# Patient Record
Sex: Female | Born: 1985 | Hispanic: Yes | Marital: Single | Smoking: Never smoker
Health system: Southern US, Community
[De-identification: ages and names within clinical notes are randomized; demographics above are authoritative.]

## PROBLEM LIST (undated history)

## (undated) DIAGNOSIS — E119 Type 2 diabetes mellitus without complications: Secondary | ICD-10-CM

## (undated) DIAGNOSIS — K2970 Gastritis, unspecified, without bleeding: Secondary | ICD-10-CM

## (undated) DIAGNOSIS — K760 Fatty (change of) liver, not elsewhere classified: Secondary | ICD-10-CM

## (undated) HISTORY — PX: NO PAST SURGERIES: SHX2092

## (undated) HISTORY — DX: Type 2 diabetes mellitus without complications: E11.9

---

## 2021-05-02 ENCOUNTER — Emergency Department (HOSPITAL_COMMUNITY)
Admission: EM | Admit: 2021-05-02 | Discharge: 2021-05-02 | Disposition: A | Discharge status: Home / Self Care | Payer: Self-pay | Attending: Emergency Medicine | Admitting: Emergency Medicine

## 2021-05-02 ENCOUNTER — Encounter (HOSPITAL_COMMUNITY): Payer: Self-pay

## 2021-05-02 ENCOUNTER — Emergency Department (HOSPITAL_COMMUNITY): Payer: Self-pay

## 2021-05-02 ENCOUNTER — Other Ambulatory Visit: Payer: Self-pay

## 2021-05-02 DIAGNOSIS — R519 Headache, unspecified: Secondary | ICD-10-CM | POA: Insufficient documentation

## 2021-05-02 DIAGNOSIS — K2970 Gastritis, unspecified, without bleeding: Secondary | ICD-10-CM | POA: Insufficient documentation

## 2021-05-02 DIAGNOSIS — Z20822 Contact with and (suspected) exposure to covid-19: Secondary | ICD-10-CM | POA: Insufficient documentation

## 2021-05-02 DIAGNOSIS — R42 Dizziness and giddiness: Secondary | ICD-10-CM | POA: Insufficient documentation

## 2021-05-02 LAB — URINALYSIS, ROUTINE W REFLEX MICROSCOPIC
Bilirubin Urine: NEGATIVE
Glucose, UA: NEGATIVE mg/dL
Hgb urine dipstick: NEGATIVE
Ketones, ur: NEGATIVE mg/dL
Leukocytes,Ua: NEGATIVE
Nitrite: NEGATIVE
Protein, ur: NEGATIVE mg/dL
Specific Gravity, Urine: 1.01 (ref 1.005–1.030)
pH: 6 (ref 5.0–8.0)

## 2021-05-02 LAB — CBC WITH DIFFERENTIAL/PLATELET
Abs Immature Granulocytes: 0.02 10*3/uL (ref 0.00–0.07)
Basophils Absolute: 0 10*3/uL (ref 0.0–0.1)
Basophils Relative: 0 %
Eosinophils Absolute: 0.1 10*3/uL (ref 0.0–0.5)
Eosinophils Relative: 1 %
HCT: 37.8 % (ref 36.0–46.0)
Hemoglobin: 13 g/dL (ref 12.0–15.0)
Immature Granulocytes: 0 %
Lymphocytes Relative: 31 %
Lymphs Abs: 3.1 10*3/uL (ref 0.7–4.0)
MCH: 28.6 pg (ref 26.0–34.0)
MCHC: 34.4 g/dL (ref 30.0–36.0)
MCV: 83.1 fL (ref 80.0–100.0)
Monocytes Absolute: 0.6 10*3/uL (ref 0.1–1.0)
Monocytes Relative: 6 %
Neutro Abs: 6.3 10*3/uL (ref 1.7–7.7)
Neutrophils Relative %: 62 %
Platelets: 193 10*3/uL (ref 150–400)
RBC: 4.55 MIL/uL (ref 3.87–5.11)
RDW: 12.7 % (ref 11.5–15.5)
WBC: 10.2 10*3/uL (ref 4.0–10.5)
nRBC: 0 % (ref 0.0–0.2)

## 2021-05-02 LAB — COMPREHENSIVE METABOLIC PANEL
ALT: 30 U/L (ref 0–44)
AST: 24 U/L (ref 15–41)
Albumin: 4.4 g/dL (ref 3.5–5.0)
Alkaline Phosphatase: 86 U/L (ref 38–126)
Anion gap: 10 (ref 5–15)
BUN: 10 mg/dL (ref 6–20)
CO2: 23 mmol/L (ref 22–32)
Calcium: 9.4 mg/dL (ref 8.9–10.3)
Chloride: 105 mmol/L (ref 98–111)
Creatinine, Ser: 0.68 mg/dL (ref 0.44–1.00)
GFR, Estimated: >60 mL/min (ref >60)
Glucose, Bld: 179 mg/dL — ABNORMAL HIGH (ref 70–99)
Potassium: 3.7 mmol/L (ref 3.5–5.1)
Sodium: 138 mmol/L (ref 135–145)
Total Bilirubin: 0.6 mg/dL (ref 0.3–1.2)
Total Protein: 8.2 g/dL — ABNORMAL HIGH (ref 6.5–8.1)

## 2021-05-02 LAB — LIPASE, BLOOD: Lipase: 26 U/L (ref 11–51)

## 2021-05-02 LAB — I-STAT BETA HCG BLOOD, ED (MC, WL, AP ONLY): I-stat hCG, quantitative: <5 m[IU]/mL (ref <5)

## 2021-05-02 LAB — RESP PANEL BY RT-PCR (FLU A&B, COVID) ARPGX2
Influenza A by PCR: NEGATIVE
Influenza B by PCR: NEGATIVE
SARS Coronavirus 2 by RT PCR: NEGATIVE

## 2021-05-02 MED ORDER — PANTOPRAZOLE SODIUM 40 MG PO TBEC: 40.0000 mg | DELAYED_RELEASE_TABLET | Freq: Every day | ORAL | 0 refills | Status: DC

## 2021-05-02 MED ORDER — SODIUM CHLORIDE 0.9 % IV BOLUS
500.0000 mL | Freq: Once | INTRAVENOUS | Status: AC
  Administered 2021-05-02: 500 mL via INTRAVENOUS

## 2021-05-02 MED ORDER — DEXAMETHASONE SODIUM PHOSPHATE 10 MG/ML IJ SOLN
10.0000 mg | Freq: Once | INTRAMUSCULAR | Status: AC
  Administered 2021-05-02: 10 mg via INTRAVENOUS
  Filled 2021-05-02: qty 1

## 2021-05-02 MED ORDER — DIPHENHYDRAMINE HCL 50 MG/ML IJ SOLN
12.5000 mg | Freq: Once | INTRAMUSCULAR | Status: AC
  Administered 2021-05-02: 12.5 mg via INTRAVENOUS
  Filled 2021-05-02: qty 1

## 2021-05-02 MED ORDER — KETOROLAC TROMETHAMINE 30 MG/ML IJ SOLN
30.0000 mg | Freq: Once | INTRAMUSCULAR | Status: AC
  Administered 2021-05-02: 30 mg via INTRAVENOUS
  Filled 2021-05-02: qty 1

## 2021-05-02 MED ORDER — PROCHLORPERAZINE EDISYLATE 10 MG/2ML IJ SOLN
10.0000 mg | Freq: Once | INTRAMUSCULAR | Status: AC
  Administered 2021-05-02: 10 mg via INTRAVENOUS
  Filled 2021-05-02: qty 2

## 2021-05-02 MED ORDER — ONDANSETRON HCL 4 MG/2ML IJ SOLN
4.0000 mg | Freq: Once | INTRAMUSCULAR | Status: AC
  Administered 2021-05-02: 4 mg via INTRAVENOUS
  Filled 2021-05-02: qty 2

## 2021-05-02 NOTE — ED Triage Notes (Signed)
Interpreter used for triage.   Vomiting, dizziness and headache that got worse today. Pt says again, that she always vomits and has a headache but dizziness was new for her.

## 2021-05-02 NOTE — ED Provider Notes (Signed)
**Kristen Kristen** Kristen Kristen   CSN: [id number] Arrival date & time: 05/02/21  0007     History Chief Complaint  Patient presents with   Vomiting    Kristen Kristen is a 35 y.o. female presents to the emergency room with complaints of headache, nausea, vomiting and dizziness. Pt reports dizziness and nausea began 1 week ago and the headache 1 night ago. Pt describes the headache as a strong pain.  Pt endorses photophobia with the headache.   Pt reports the dizziness is intermittent approx 3-4 times per week but is always associated with nausea. Intermittent vomiting but NBNB.  She is unable to describe the dizziness.  No previous symptoms like this.  Denies trauma, MVA, falls or other head injuries.  Denies fever, chills, chest pain, SOB, abd pain, syncope. No previous headaches. Current headache is rated at a 9/10.  No treatments PTA.    The history is provided by the patient, the spouse and medical records. A language interpreter was used.      History reviewed. No pertinent past medical history.  There are no problems to display for this patient.   History reviewed. No pertinent surgical history.   OB History   No obstetric history on file.     No family history on file.  Social History   Tobacco Use   Smoking status: Never   Smokeless tobacco: Never  Substance Use Topics   Alcohol use: Not Currently   Drug use: Never    Home Medications Prior to Admission medications   Medication Sig Start Date End Date Taking? Authorizing Provider  pantoprazole (PROTONIX) 40 MG tablet Take 1 tablet (40 mg total) by mouth daily. 05/02/21  Yes Arlethia Basso, Dahlia Client, PA-C    Allergies    Patient has no known allergies.  Review of Systems   Review of Systems  Constitutional:  Negative for appetite change, diaphoresis, fatigue, fever and unexpected weight change.  HENT:  Negative for mouth sores.   Eyes:  Negative for visual disturbance.   Respiratory:  Negative for cough, chest tightness, shortness of breath and wheezing.   Cardiovascular:  Negative for chest pain.  Gastrointestinal:  Positive for nausea and vomiting. Negative for abdominal pain, constipation and diarrhea.  Endocrine: Negative for polydipsia, polyphagia and polyuria.  Genitourinary:  Negative for dysuria, frequency, hematuria and urgency.  Musculoskeletal:  Negative for back pain and neck stiffness.  Skin:  Negative for rash.  Allergic/Immunologic: Negative for immunocompromised state.  Neurological:  Positive for dizziness and headaches. Negative for syncope and light-headedness.  Hematological:  Does not bruise/bleed easily.  Psychiatric/Behavioral:  Negative for sleep disturbance. The patient is not nervous/anxious.    Physical Exam Updated Vital Signs BP 116/84 (BP Location: Right Arm)   Pulse 78   Temp 98.8 F (37.1 C) (Oral)   Resp 18   Wt 101.6 kg   SpO2 100%   Physical Exam Vitals and nursing Kristen reviewed.  Constitutional:      General: She is not in acute distress.    Appearance: She is well-developed. She is not diaphoretic.  HENT:     Head: Normocephalic and atraumatic.     Right Ear: Tympanic membrane normal.     Left Ear: Tympanic membrane normal.     Nose: Nose normal.     Mouth/Throat:     Mouth: Mucous membranes are moist.  Eyes:     General: No scleral icterus.    Conjunctiva/sclera: Conjunctivae normal.  Pupils: Pupils are equal, round, and reactive to light.     Comments: No horizontal, vertical or rotational nystagmus  Neck:     Comments: Full active and passive ROM without pain No midline or paraspinal tenderness No nuchal rigidity or meningeal signs Cardiovascular:     Rate and Rhythm: Normal rate and regular rhythm.  Pulmonary:     Effort: Pulmonary effort is normal. No respiratory distress.     Breath sounds: No wheezing or rales.  Abdominal:     General: There is no distension.     Palpations: Abdomen is  soft.     Tenderness: There is no abdominal tenderness. There is no guarding or rebound.  Musculoskeletal:        General: Normal range of motion.     Cervical back: Normal range of motion and neck supple.  Lymphadenopathy:     Cervical: No cervical adenopathy.  Skin:    General: Skin is warm and dry.     Findings: No rash.  Neurological:     Mental Status: She is alert and oriented to person, place, and time.     Cranial Nerves: No cranial nerve deficit.     Motor: No abnormal muscle tone.     Coordination: Coordination normal.     Comments: Mental Status:  Alert, oriented, thought content appropriate. Speech fluent without evidence of aphasia. Able to follow 2 step commands without difficulty.  Cranial Nerves:  II:  Peripheral visual fields grossly normal, pupils equal, round, reactive to light III,IV, VI: ptosis not present, extra-ocular motions intact bilaterally  V,VII: smile symmetric, facial light touch sensation equal VIII: hearing grossly normal bilaterally  IX,X: midline uvula rise  XI: bilateral shoulder shrug equal and strong XII: midline tongue extension  Motor:  5/5 in upper and lower extremities bilaterally including strong and equal grip strength and dorsiflexion/plantar flexion Sensory: Pinprick and light touch normal in all extremities.  Cerebellar: normal finger-to-nose with bilateral upper extremities Gait: normal gait and balance CV: distal pulses palpable throughout   Psychiatric:        Behavior: Behavior normal.        Thought Content: Thought content normal.        Judgment: Judgment normal.    ED Results / Procedures / Treatments   Labs (all labs ordered are listed, but only abnormal results are displayed) Labs Reviewed  COMPREHENSIVE METABOLIC PANEL - Abnormal; Notable for the following components:      Result Value   Glucose, Bld 179 (*)    Total Protein 8.2 (*)    All other components within normal limits  RESP PANEL BY RT-PCR (FLU A&B,  COVID) ARPGX2  CBC WITH DIFFERENTIAL/PLATELET  LIPASE, BLOOD  URINALYSIS, ROUTINE W REFLEX MICROSCOPIC  I-STAT BETA HCG BLOOD, ED ([facility], [facility], [facility] ONLY)    Radiology CT HEAD WO CONTRAST ( )  Result Date: 05/02/2021 CLINICAL DATA:  35 year old female with increasing headache. Dizziness and vomiting. EXAM: CT HEAD WITHOUT CONTRAST TECHNIQUE: Contiguous axial images were obtained from the base of the skull through the vertex without intravenous contrast. COMPARISON:  None. FINDINGS: Brain: Normal cerebral volume. No midline shift, ventriculomegaly, mass effect, evidence of mass lesion, intracranial hemorrhage or evidence of cortically based acute infarction. Gray-white matter differentiation is within normal limits throughout the brain. Vascular: No suspicious intracranial vascular hyperdensity. Skull: Negative. Sinuses/Orbits: Visualized paranasal sinuses and mastoids are clear. Tympanic cavities are clear. Other: Visualized orbits and scalp soft tissues are within normal limits. IMPRESSION: Normal noncontrast Head CT.  Electronically Signed   By: Odessa Fleming M.D.   On: 05/02/2021 04:52    Procedures Procedures   Medications Ordered in ED Medications  ondansetron (ZOFRAN) injection 4 mg (4 mg Intravenous Given 05/02/21 0510)  sodium chloride 0.9 % bolus 500 mL (500 mLs Intravenous New Bag/Given 05/02/21 0521)  prochlorperazine (COMPAZINE) injection 10 mg (10 mg Intravenous Given 05/02/21 0558)  diphenhydrAMINE (BENADRYL) injection 12.5 mg (12.5 mg Intravenous Given 05/02/21 0557)  ketorolac (TORADOL) 30 MG/ML injection 30 mg (30 mg Intravenous Given 05/02/21 0558)  dexamethasone (DECADRON) injection 10 mg (10 mg Intravenous Given 05/02/21 9702)    ED Course  I have reviewed the triage vital signs and the nursing notes.  Pertinent labs & imaging results that were available during my care of the patient were reviewed by me and considered in my medical decision making (see chart for details).    MDM  Rules/Calculators/A&P                           Pt presents with dizziness, headache, nausea and vomiting. Labs reassuring.  No evidence of urinary tract infection.  CT head without acute abnormality.  Normal neurologic exam.  Abdomen soft and nontender, moist mucous membranes.  Patient will be given migraine cocktail.  Long discussion with patient and family about potential causes for dizziness.  COVID pending.  Patient will need close follow-up with neurology.  Dizziness is intermittent has been present for several weeks.  Low likelihood acute stroke today.  Patient does complain of some ongoing gastritis and reflux symptoms.  No current treatments.  Will give Protonix.  6:43 AM At shift change care was transferred to [facility] who will follow pending studies, re-evaulate and determine disposition.      Final Clinical Impression(s) / ED Diagnoses Final diagnoses:  Dizziness  Generalized headache  Gastritis without bleeding, unspecified chronicity, unspecified gastritis type    Rx / DC Orders ED Discharge Orders          Ordered    pantoprazole (PROTONIX) 40 MG tablet  Daily        05/02/21 0644             Loyalty Arentz, Dahlia Client, PA-C 05/02/21 [id number]    Glynn Octave, MD 05/02/21 [phone]

## 2021-05-02 NOTE — ED Provider Notes (Signed)
Care transferred from Muthersbaugh PA-C at shift change. Refer to her note for H&P.   Patient able to ambulate and drink fluids without N/V. On evaluation, patient states she is feeling much better. Discussed negative COVID test and referral to outpatient neurology. Patient is stable for discharge. Patient agreeable to plan.    Kristen Harvey 05/02/21 4076    Wynetta Fines, MD 05/02/21 1444

## 2021-05-02 NOTE — ED Notes (Signed)
Pt tolerated ambulating to the bathroom. No complain of N/V.

## 2021-05-02 NOTE — ED Notes (Signed)
Patient tolerated P.O. Challenge ?

## 2021-05-02 NOTE — Discharge Instructions (Addendum)
1. Medications: Protonix daily, usual home medications 2. Treatment: rest, drink plenty of fluids,  3. Follow Up: Please followup with your primary doctor in 2-3 days for discussion of your diagnoses and further evaluation after today's visit; if you do not have a primary care doctor use the resource guide provided to find one; Please return to the ER for new or worsening symptoms

## 2021-09-07 ENCOUNTER — Encounter (HOSPITAL_COMMUNITY): Payer: Self-pay | Admitting: Obstetrics & Gynecology

## 2021-09-07 ENCOUNTER — Other Ambulatory Visit: Payer: Self-pay

## 2021-09-07 ENCOUNTER — Inpatient Hospital Stay (HOSPITAL_COMMUNITY): Payer: Self-pay

## 2021-09-07 ENCOUNTER — Inpatient Hospital Stay (HOSPITAL_COMMUNITY)
Admission: AD | Admit: 2021-09-07 | Discharge: 2021-09-07 | Disposition: A | Discharge status: Home / Self Care | Payer: Self-pay | Attending: Obstetrics & Gynecology | Admitting: Obstetrics & Gynecology

## 2021-09-07 DIAGNOSIS — O219 Vomiting of pregnancy, unspecified: Secondary | ICD-10-CM

## 2021-09-07 DIAGNOSIS — O26891 Other specified pregnancy related conditions, first trimester: Secondary | ICD-10-CM | POA: Insufficient documentation

## 2021-09-07 DIAGNOSIS — R112 Nausea with vomiting, unspecified: Secondary | ICD-10-CM | POA: Insufficient documentation

## 2021-09-07 DIAGNOSIS — R109 Unspecified abdominal pain: Secondary | ICD-10-CM | POA: Insufficient documentation

## 2021-09-07 DIAGNOSIS — Z3A12 12 weeks gestation of pregnancy: Secondary | ICD-10-CM | POA: Insufficient documentation

## 2021-09-07 DIAGNOSIS — O4691 Antepartum hemorrhage, unspecified, first trimester: Secondary | ICD-10-CM | POA: Insufficient documentation

## 2021-09-07 HISTORY — DX: Gastritis, unspecified, without bleeding: K29.70

## 2021-09-07 HISTORY — DX: Fatty (change of) liver, not elsewhere classified: K76.0

## 2021-09-07 LAB — CBC
HCT: 36.5 % (ref 36.0–46.0)
Hemoglobin: 13 g/dL (ref 12.0–15.0)
MCH: 29 pg (ref 26.0–34.0)
MCHC: 35.6 g/dL (ref 30.0–36.0)
MCV: 81.3 fL (ref 80.0–100.0)
Platelets: 200 10*3/uL (ref 150–400)
RBC: 4.49 MIL/uL (ref 3.87–5.11)
RDW: 13.2 % (ref 11.5–15.5)
WBC: 7 10*3/uL (ref 4.0–10.5)
nRBC: 0 % (ref 0.0–0.2)

## 2021-09-07 LAB — URINALYSIS, ROUTINE W REFLEX MICROSCOPIC
Bilirubin Urine: NEGATIVE
Glucose, UA: NEGATIVE mg/dL
Hgb urine dipstick: NEGATIVE
Ketones, ur: >80 mg/dL — AB
Nitrite: NEGATIVE
Protein, ur: NEGATIVE mg/dL
Specific Gravity, Urine: 1.02 (ref 1.005–1.030)
pH: 6 (ref 5.0–8.0)

## 2021-09-07 LAB — WET PREP, GENITAL
Clue Cells Wet Prep HPF POC: NONE SEEN
Sperm: NONE SEEN
Trich, Wet Prep: NONE SEEN
WBC, Wet Prep HPF POC: >=10 — AB (ref <10)
Yeast Wet Prep HPF POC: NONE SEEN

## 2021-09-07 LAB — URINALYSIS, MICROSCOPIC (REFLEX)

## 2021-09-07 LAB — ABO/RH: ABO/RH(D): O POS

## 2021-09-07 LAB — HCG, QUANTITATIVE, PREGNANCY: hCG, Beta Chain, Quant, S: 75755 m[IU]/mL — ABNORMAL HIGH (ref <5)

## 2021-09-07 MED ORDER — LACTATED RINGERS IV BOLUS
1000.0000 mL | Freq: Once | INTRAVENOUS | Status: AC
  Administered 2021-09-07: 1000 mL via INTRAVENOUS

## 2021-09-07 MED ORDER — METOCLOPRAMIDE HCL 5 MG/ML IJ SOLN
10.0000 mg | Freq: Once | INTRAMUSCULAR | Status: AC
  Administered 2021-09-07: 10 mg via INTRAVENOUS
  Filled 2021-09-07: qty 2

## 2021-09-07 MED ORDER — METOCLOPRAMIDE HCL 10 MG PO TABS: 10.0000 mg | ORAL_TABLET | Freq: Three times a day (TID) | ORAL | 0 refills | Status: DC | PRN

## 2021-09-07 MED ORDER — ACETAMINOPHEN 500 MG PO TABS
1000.0000 mg | ORAL_TABLET | Freq: Once | ORAL | Status: AC
  Administered 2021-09-07: 1000 mg via ORAL
  Filled 2021-09-07: qty 2

## 2021-09-07 NOTE — MAU Provider Note (Addendum)
History     CSN: [id number]  Arrival date and time: 09/07/21 [id number]   Event Date/Time   First Provider Initiated Contact with Patient 09/07/21 0950      Chief Complaint  Patient presents with   Nausea   Abdominal Pain   Vaginal Bleeding   Kristen Harvey is a 36yo G3P2002 at [redacted]w[redacted]d who presents for left-sided abdominal pain and blood in urine. She felt a burning pain in her abdomen this morning and noticed blood in her urine. She has not been to the bathroom since this time but the pain has continued and is at 8/10. She does not have any pain or burning with urination. She has noticed some white discharge with urination. She was told she had an ovarian cyst.  She has had nausea and vomiting since the beginning of her pregnancy and has thrown up about 7 times in the last 24 hours. She has not been able to keep food or water down. Her last BM was yesterday morning.    OB History     Gravida  3   Para  2   Term  2   Preterm      AB      Living  2      SAB      IAB      Ectopic      Multiple      Live Births  2         She has had 2 prior vaginal deliveries. There was concern for gestational diabetes during her second pregnancy but she did not get further testing.   Past Medical History:  Diagnosis Date   Fatty liver    Gastritis     History reviewed. No pertinent surgical history.  Family History  Problem Relation Age of Onset   Hypertension Mother    Diabetes Mother    Hypertension Father    Diabetes Father     Social History   Tobacco Use   Smoking status: Never   Smokeless tobacco: Never  Substance Use Topics   Alcohol use: Not Currently   Drug use: Never    Allergies: No Known Allergies  Medications Prior to Admission  Medication Sig Dispense Refill Last Dose   pantoprazole (PROTONIX) 40 MG tablet Take 1 tablet (40 mg total) by mouth daily. 30 tablet 0     Review of Systems  Constitutional:  Negative for chills and fever.   Cardiovascular:  Negative for chest pain.  Gastrointestinal:  Positive for abdominal pain, nausea and vomiting.  Genitourinary:  Positive for hematuria and vaginal discharge.  Physical Exam   Blood pressure 120/87, pulse 89, temperature 98.9 F (37.2 C), temperature source Oral, resp. rate 16, last menstrual period 06/10/2021, SpO2 99 %.  Physical Exam HENT:     Head: Normocephalic and atraumatic.  Eyes:     Extraocular Movements: Extraocular movements intact.  Cardiovascular:     Heart sounds: Normal heart sounds.  Pulmonary:     Effort: Pulmonary effort is normal.     Breath sounds: Normal breath sounds.  Abdominal:     Palpations: Abdomen is soft.     Tenderness: There is abdominal tenderness in the left upper quadrant and left lower quadrant. There is no right CVA tenderness, left CVA tenderness or rebound.  Skin:    General: Skin is warm and dry.  Neurological:     General: No focal deficit present.     Mental Status: She is alert.  Psychiatric:  Mood and Affect: Mood normal.        Behavior: Behavior normal.    MAU Course  Procedures Results for orders placed or performed during the hospital encounter of 09/07/21 (from the past 24 hour(s))  CBC     Status: None   Collection Time: 09/07/21 10:20 AM  Result Value Ref Range   WBC 7.0 4.0 - 10.5 K/uL   RBC 4.49 3.87 - 5.11 MIL/uL   Hemoglobin 13.0 12.0 - 15.0 g/dL   HCT 36.5 36.0 - 46.0 %   MCV 81.3 80.0 - 100.0 fL   MCH 29.0 26.0 - 34.0 pg   MCHC 35.6 30.0 - 36.0 g/dL   RDW 13.2 11.5 - 15.5 %   Platelets 200 150 - 400 K/uL   nRBC 0.0 0.0 - 0.2 %  ABO/Rh     Status: None   Collection Time: 09/07/21 10:20 AM  Result Value Ref Range   ABO/RH(D) O POS    No rh immune globuloin      NOT A RH IMMUNE GLOBULIN CANDIDATE, PT RH POSITIVE Performed at [facility], [place] [address] [place], [place] [place] [address]   hCG, quantitative, pregnancy     Status: Abnormal   Collection Time: 09/07/21 10:20 AM  Result  Value Ref Range   hCG, Beta Chain, Quant, S 75,755 (H) <5 mIU/mL  Urinalysis, Routine w reflex microscopic Urine, Clean Catch     Status: Abnormal   Collection Time: 09/07/21 10:32 AM  Result Value Ref Range   Color, Urine YELLOW YELLOW   APPearance CLEAR CLEAR   Specific Gravity, Urine 1.020 1.005 - 1.030   pH 6.0 5.0 - 8.0   Glucose, UA NEGATIVE NEGATIVE mg/dL   Hgb urine dipstick NEGATIVE NEGATIVE   Bilirubin Urine NEGATIVE NEGATIVE   Ketones, ur >80 (A) NEGATIVE mg/dL   Protein, ur NEGATIVE NEGATIVE mg/dL   Nitrite NEGATIVE NEGATIVE   Leukocytes,Ua TRACE (A) NEGATIVE  Urinalysis, Microscopic (reflex)     Status: Abnormal   Collection Time: 09/07/21 10:32 AM  Result Value Ref Range   RBC / HPF 0-5 0 - 5 RBC/hpf   WBC, UA 0-5 0 - 5 WBC/hpf   Bacteria, UA RARE (A) NONE SEEN   Squamous Epithelial / LPF 0-5 0 - 5  Wet prep, genital     Status: Abnormal   Collection Time: 09/07/21 11:43 AM  Result Value Ref Range   Yeast Wet Prep HPF POC NONE SEEN NONE SEEN   Trich, Wet Prep NONE SEEN NONE SEEN   Clue Cells Wet Prep HPF POC NONE SEEN NONE SEEN   WBC, Wet Prep HPF POC >=10 (A) <10   Sperm NONE SEEN    US OB Comp Less 14 Wks  Result Date: 09/07/2021 CLINICAL DATA:  Pain, vaginal bleeding EXAM: OBSTETRIC <14 WK [place] US DOPPLER ULTRASOUND OF OVARIES TECHNIQUE: Transabdominal ultrasound was performed for evaluation of the gestation as well as the maternal uterus and adnexal regions. Color and duplex Doppler ultrasound was utilized to evaluate blood flow to the ovaries. COMPARISON:  None. FINDINGS: Intrauterine gestational sac: Single Yolk sac:  Not Visualized. Embryo:  Visualized. Cardiac Activity: Visualized. Heart Rate: 151 bpm CRL: 68.6 mm   13 w 1 d                  [place] EDC: 03/14/2022 Subchorionic hemorrhage:  None visualized. Maternal uterus/adnexae: The right ovary is normal in appearance, measuring 2.9 cm x 3.5 cm x 3.0 cm. A small  follicle is seen in the right ovary. The left ovary  is normal measuring 1.5 cm x 2.7 cm x 1.7 cm. No left adnexal lesion is seen. Pulsed Doppler evaluation of both ovaries demonstrates normal appearing low-resistance arterial and venous waveforms. IMPRESSION: 1. Single live intrauterine pregnancy identified with an estimated gestational age of [redacted] weeks 1 day by crown-rump length. 2. No evidence of ovarian torsion. Electronically Signed   By: Valetta Mole M.D.   On: 09/07/2021 12:54   US PELVIC DOPPLER (TORSION R/O OR MASS ARTERIAL FLOW)  Result Date: 09/07/2021 CLINICAL DATA:  Pain, vaginal bleeding EXAM: OBSTETRIC <14 WK [place] US DOPPLER ULTRASOUND OF OVARIES TECHNIQUE: Transabdominal ultrasound was performed for evaluation of the gestation as well as the maternal uterus and adnexal regions. Color and duplex Doppler ultrasound was utilized to evaluate blood flow to the ovaries. COMPARISON:  None. FINDINGS: Intrauterine gestational sac: Single Yolk sac:  Not Visualized. Embryo:  Visualized. Cardiac Activity: Visualized. Heart Rate: 151 bpm CRL: 68.6 mm   13 w 1 d                  [place] EDC: 03/14/2022 Subchorionic hemorrhage:  None visualized. Maternal uterus/adnexae: The right ovary is normal in appearance, measuring 2.9 cm x 3.5 cm x 3.0 cm. A small follicle is seen in the right ovary. The left ovary is normal measuring 1.5 cm x 2.7 cm x 1.7 cm. No left adnexal lesion is seen. Pulsed Doppler evaluation of both ovaries demonstrates normal appearing low-resistance arterial and venous waveforms. IMPRESSION: 1. Single live intrauterine pregnancy identified with an estimated gestational age of [redacted] weeks 1 day by crown-rump length. 2. No evidence of ovarian torsion. Electronically Signed   By: Valetta Mole M.D.   On: 09/07/2021 12:54    MDM Differential diagnosis for hematuria vs vaginal bleeding includes UTI, kidney stone, vaginal infection, miscarriage, and ectopic pregnancy.   - Ectopic pregnancy is less likely due to estimated gestational age of [redacted]w[redacted]d.  -  Unremarkable UTI with no blood in urine - UTI and kidney stone less likely.  - White vaginal discharge and friable cervix visualized on speculum exam - Wet prep negative for candida, trichomonas, and BV.  - Ordered U/S to evaluate for ectopic pregnancy, ovarian cyst, and ovarian torsion.  - Started fluids and reglan for nausea. - Tylenol for pain.  Pending: U/S, GC/chlamydia swab Care taken over by NP  Reggy Eye, Medical Student 09/07/2021 12:34PM     Attestation of Supervision of Student:  I confirm that I have verified the information documented in the nurse practitioner students note and that I have also personally performed the history, physical exam and all medical decision making activities.  I have verified that all services and findings are accurately documented in this student's note; and I agree with management and plan as outlined in the documentation. I have also made any necessary editorial changes.  History Kristen Harvey is a 36 y.o. G3P2002 at [redacted]w[redacted]d who presents via EMS for abdominal pain & vaginal bleeding. Symptoms started this morning. Reports constant burning pain in left lower quadrant. Rates pain 8/10. Nothing makes better or worse. Reports seeing blood in her urine this morning x 1 episode. Also has had continued n/v with the pregnancy. States she's vomited 10 times in the last 24 hours. Does not have antiemetic.  Denies fever, diarrhea, constipation, vaginal discharge, dysuria, or flank pain. Last BM was this morning.   Physical exam BP 104/67    Pulse  74    Temp 98.9 F (37.2 C) (Oral)    Resp 16    LMP 06/10/2021    SpO2 99% Comment: room air  Physical Examination: General appearance - alert, well appearing, and in no distress Mental status - normal mood, behavior, speech, dress, motor activity, and thought processes Eyes - sclera anicteric Chest - clear to auscultation, no wheezes, rales or rhonchi, symmetric air entry Abdomen - soft, flat, TTP  in LLQ. No rebound or guarding. No CVA tenderness Pelvic - NEFG, no blood, moderate amount of tan discharge. Cervix closed/firm Skin - warm & dry  MDM FHT present via doppler  On exam, no blood seen. She is RH positive. Wet prep is negative.  U/a shows trace leuks. No urinary complaints, afebrile, & no CVAT. Urine culture sent.   Treated in MAU with IV fluids, reglan, & tylenol. No vomiting in MAU. Abdominal pain completely resolved.   Ultrasound ordered due to patient reporting history of ovarian cyst last year. Ultrasound shows IUP & no abnormal adnexal masses.   [facility] provided Spanish interpreter Angelica Chessman) used for this encounter* Assessment and Plan  Assessment/Plan 1. Nausea and vomiting during pregnancy prior to [redacted] weeks gestation  -rx reglan  2. [redacted] weeks gestation of pregnancy  -start prenatal care  3. Abdominal pain during pregnancy in first trimester  -reviewed SAB precautions & reasons to return to [place], [place] for [employer] 09/07/2021 3:31 PM

## 2021-09-07 NOTE — MAU Note (Signed)
Kristen Harvey is a 36 y.o. at [redacted]w[redacted]d here in MAU reporting: pt arrived via EMS stating that she went to the bathroom and noticed some bleeding. States she has not seen anymore bleeding. No clots. Also reporting lower abdominal pain that radiates to her upper abdomen. Also having ongoing nausea and vomiting. Reports 7 episodes of emesis.   [facility] and reports she had an u/s.  LMP: 06/10/21  Onset of complaint: ongoing  Pain score: 8/10  Vitals:   09/07/21 0957  BP: 120/87  Pulse: 89  Resp: 16  Temp: 98.9 F (37.2 C)  SpO2: 99%     FHT:159  Lab orders placed from triage: UA

## 2021-09-08 LAB — GC/CHLAMYDIA PROBE AMP (~~LOC~~) NOT AT ARMC
Chlamydia: NEGATIVE
Comment: NEGATIVE
Comment: NORMAL
Neisseria Gonorrhea: NEGATIVE

## 2021-09-08 LAB — CULTURE, OB URINE: Special Requests: NORMAL

## 2021-09-19 ENCOUNTER — Other Ambulatory Visit: Payer: Self-pay

## 2021-09-19 ENCOUNTER — Other Ambulatory Visit: Payer: Self-pay | Admitting: *Deleted

## 2021-09-19 DIAGNOSIS — Z3481 Encounter for supervision of other normal pregnancy, first trimester: Secondary | ICD-10-CM

## 2021-09-21 LAB — CBC/D/PLT+RPR+RH+ABO+RUBIGG...
Antibody Screen: NEGATIVE
Basophils Absolute: 0 10*3/uL (ref 0.0–0.2)
Basos: 0 %
Bilirubin, UA: NEGATIVE
EOS (ABSOLUTE): 0.1 10*3/uL (ref 0.0–0.4)
Eos: 1 %
Glucose, UA: NEGATIVE
HCV Ab: <0.1 s/co ratio (ref 0.0–0.9)
HIV Screen 4th Generation wRfx: NONREACTIVE
Hematocrit: 35.4 % (ref 34.0–46.6)
Hemoglobin: 12 g/dL (ref 11.1–15.9)
Hepatitis B Surface Ag: NEGATIVE
Immature Grans (Abs): 0 10*3/uL (ref 0.0–0.1)
Immature Granulocytes: 0 %
Ketones, UA: NEGATIVE
Lymphocytes Absolute: 2.3 10*3/uL (ref 0.7–3.1)
Lymphs: 32 %
MCH: 28.2 pg (ref 26.6–33.0)
MCHC: 33.9 g/dL (ref 31.5–35.7)
MCV: 83 fL (ref 79–97)
Monocytes Absolute: 0.5 10*3/uL (ref 0.1–0.9)
Monocytes: 7 %
Neutrophils Absolute: 4.3 10*3/uL (ref 1.4–7.0)
Neutrophils: 60 %
Nitrite, UA: NEGATIVE
Platelets: 177 10*3/uL (ref 150–450)
Protein,UA: NEGATIVE
RBC, UA: NEGATIVE
RBC: 4.25 x10E6/uL (ref 3.77–5.28)
RDW: 14.3 % (ref 11.7–15.4)
RPR Ser Ql: NONREACTIVE
Rh Factor: POSITIVE
Rubella Antibodies, IGG: 19 index (ref >0.99)
Specific Gravity, UA: 1.013 (ref 1.005–1.030)
Urobilinogen, Ur: 0.2 mg/dL (ref 0.2–1.0)
WBC: 7.1 10*3/uL (ref 3.4–10.8)
pH, UA: 6 (ref 5.0–7.5)

## 2021-09-21 LAB — URINE CULTURE, OB REFLEX

## 2021-09-21 LAB — HGB FRACTIONATION CASCADE
Hgb A2: 3.2 % (ref 1.8–3.2)
Hgb A: 56.2 % — ABNORMAL LOW (ref 96.4–98.8)
Hgb F: 0 % (ref 0.0–2.0)
Hgb S: 40.6 % — ABNORMAL HIGH

## 2021-09-21 LAB — MICROSCOPIC EXAMINATION
Bacteria, UA: NONE SEEN
Casts: NONE SEEN /lpf
RBC, Urine: NONE SEEN /hpf (ref 0–2)

## 2021-09-21 LAB — HGB SOLUBILITY: Hgb Solubility: POSITIVE — AB

## 2021-09-21 LAB — HCV INTERPRETATION

## 2021-09-25 DIAGNOSIS — Z3490 Encounter for supervision of normal pregnancy, unspecified, unspecified trimester: Secondary | ICD-10-CM | POA: Insufficient documentation

## 2021-09-25 NOTE — Progress Notes (Addendum)
Patient Name: Angely Ponds Munguia-Rodas Date of Birth: 09/23/1985 [place] Initial Prenatal Visit  Rodricka Catron Sa is a 36 y.o. year old G3P2002 at [redacted]w[redacted]d who presents for her initial prenatal visit.  An inperson Spanish speaking interpretor was used for this encounter.    Attended with baby's father-Antonio who is not her partner  Pregnancy is not planned She reports nausea, vomiting and reduced appetite  She is taking a prenatal vitamin.  She denies pelvic pain or vaginal bleeding.   Pregnancy Dating: The patient is dated by [place].  LMP: October 99991111 Period is certain:  Yes.  Periods were regular:  Yes but pt was on OCP LMP was a typical period:  No. OCP Using hormonal contraception in 3 months prior to conception: Yes  Lab Review: Blood type: O Rh Status: + Antibody screen: Negative HIV: Negative RPR: Negative Hemoglobin electrophoresis reviewed: Yes-sickle cell carrier Results of OB urine culture are: Negative Rubella: Immune Hep C Ab: Negative Varicella status is Unknown  PMH: Reviewed and as detailed below: HTN: No  Gestational Hypertension/preeclampsia: No  Type 1 or 2 Diabetes: No  Depression:  No  Seizure disorder:  No VTE: No ,  History of STI yes-chlamydia per chart review  Abnormal Pap smear:  No, Genital herpes simplex:  No   PSH: Gynecologic Surgery:  no Surgical history reviewed, notable for: no   Obstetric History: Obstetric history tab updated and reviewed.  Summary of prior pregnancies: 2 prior  Cesarean delivery: No  Gestational Diabetes:  No Hypertension in pregnancy: No History of preterm birth: No History of LGA/SGA infant:  Yes History of shoulder dystocia: No Indications for referral were reviewed, and the patient has no obstetric indications for referral to [facility] at this time.   Social History: FOB name: Antonio   Tobacco use: No Alcohol use:  No Other substance use:  No  Current  Medications:  Pre-natals  Reviewed and appropriate in pregnancy.   Genetic and Infection Screen: Flow Sheet Updated Yes  Prenatal Exam: Gen: Well nourished, well developed.  No distress.  Vitals noted. HEENT: Normocephalic, atraumatic.  Neck supple without cervical lymphadenopathy, thyromegaly or thyroid nodules.  Fair dentition. CV: RRR no murmur, gallops or rubs Lungs: CTA B.  Normal respiratory effort without wheezes or rales. Abd: soft, NTND. +BS.  Uterus not appreciated above pelvis. GU: Normal external female genitalia without lesions.  Nl vaginal, well rugated without lesions. No vaginal discharge.   Ext: No clubbing, cyanosis or edema. Psych: Normal grooming and dress.  Not depressed or anxious appearing.  Normal thought content and process without flight of ideas or looseness of associations  Fetal heart tones:  not detected today*  Assessment/Plan:  Andriana Santoni Munguia-Rodas is a 36 y.o. G3P2002 at [redacted]w[redacted]d who presents to initiate prenatal care. She is doing well.  Current pregnancy issues include nausea.  Routine prenatal care: As dating is not reliable, a dating ultrasound has not been ordered. Dating tab updated. DATING VIA [place] Pre-pregnancy weight updated. Expected weight gain this pregnancy is 11-20 pounds  Prenatal labs reviewed, notable for SICKLE CELL CARRIER . Indications for referral to HROB were reviewed and the patient does not meet criteria for referral.  Medication list reviewed and updated.  Recommended patient see a dentist for regular care.  Bleeding and pain precautions reviewed. Importance of prenatal vitamins reviewed.  Genetic screening offered. Patient opted for: quad screen at 16-20 weeks. The patient has the following indications for aspirin to begin  81 mg at 12-16 weeks: One high risk condition: no single high risk condition  MORE than one moderate risk condition: obesity and low SES   Aspirin was  recommended today based upon above risk factors  (one high risk condition or more than one moderate risk factor)  The patient will not be age 25 or over at time of delivery. Referral to genetic counseling was not offered today.  The patient has the following risk factors for preexisting diabetes: BMI > 25 and high risk ethnicity (Latino, [place] American, Native American, [place], Asian Optometrist) . An early 1 hour glucose tolerance test was ordered. Pregnancy Medical Home and PHQ-9 forms completed, problems noted: Yes  2. Pregnancy issues include the following which were addressed today. Due to time constraints, interpretor needs and pt presenting for initial Ob visit at 15 weeks unable to complete full visit. Discussed: Sickle cell carrier Varicella Ig  Anatomy US booked Pt received flu and covid vaccine today  F/u with me on 09/29/21 to complete initial OB visit and to discuss: Starting ASA 81mg   Anti-emetic medications  1 hr GTT, prescribed zofran which pt can have one hr before test to avoid nausea/vomiting Pap smear Quad screen    Flowsheet Row Initial Prenatal from 09/26/2021 in [facility]  PHQ-9 Total Score 16         Follow up 3 days

## 2021-09-25 NOTE — Assessment & Plan Note (Addendum)
°  Nursing Staff Provider  Office Location  [facility]  Dating  [place]  Language   Anatomy US    Flu Vaccine  09/26/21 Genetic Screen  NIPS:   AFP:   First Screen:  Quad:    TDaP vaccine    Hgb A1C or  GTT Early  Third trimester   Rhogam     LAB RESULTS   Feeding Plan BF  Blood Type O/Positive/-- (01/30 1102)   Contraception BLT Antibody Negative (01/30 1102)  Circumcision  Rubella 19.00 (01/30 1102)  Pediatrician  [facility]  RPR Non Reactive (01/30 1102)   Support Person  HBsAg Negative (01/30 1102)   Prenatal Classes  HIV Non Reactive (01/30 1102)  BTL Consent  GBS  (For PCN allergy, check sensitivities)   VBAC Consent  Pap   Resident PCP   Hgb Electro  Sick cell carrier   COVID Vax  09/26/21 CF   Hep C Antibody  Neg  SMA   Aspirin indicated  Yes  Waterbirth  Refer to [facility] -> [ ]  Class [ ]  Consent [ ]  CNM visit

## 2021-09-26 ENCOUNTER — Ambulatory Visit (INDEPENDENT_AMBULATORY_CARE_PROVIDER_SITE_OTHER): Payer: Self-pay | Admitting: Family Medicine

## 2021-09-26 ENCOUNTER — Ambulatory Visit (INDEPENDENT_AMBULATORY_CARE_PROVIDER_SITE_OTHER): Payer: Self-pay

## 2021-09-26 ENCOUNTER — Other Ambulatory Visit: Payer: Self-pay | Admitting: Family Medicine

## 2021-09-26 ENCOUNTER — Other Ambulatory Visit: Payer: Self-pay

## 2021-09-26 VITALS — BP 107/67 | HR 80 | Wt 215.0 lb

## 2021-09-26 DIAGNOSIS — Z23 Encounter for immunization: Secondary | ICD-10-CM

## 2021-09-26 DIAGNOSIS — Z3491 Encounter for supervision of normal pregnancy, unspecified, first trimester: Secondary | ICD-10-CM

## 2021-09-26 MED ORDER — ONDANSETRON HCL 4 MG PO TABS: 4.0000 mg | ORAL_TABLET | Freq: Once | ORAL | 0 refills | Status: AC

## 2021-09-26 NOTE — Patient Instructions (Signed)
Gracias por venir a verme hoy. Fue Psychiatrist. Hoy hablamos de Engineer, civil (consulting). No pudimos completar la visita hoy, as que regrese en [employer] para la segunda parte de esta visita.  Hemos reservado la exploracin de anatoma. Le llamaremos para la fecha y hora de la cita.  Obtendremos algunos laboratorios hoy. Si son anormales o necesitamos hacer algo al respecto, lo llamar. Si son normales, Transport planner un mensaje en MyChart (si est Musician) o una carta por correo. Si no recibe noticias nuestras en 2 semanas, llame a la oficina al nmero que aparece a continuacin.  Por favor, sgueme en unos das.  Si tiene alguna pregunta o inquietud, no dude en llamar a la oficina al [phone].  Los mejores deseos,  Dr Arletta Bale a MAU en [facility] en [place] si: ? Tiene calambres/contracciones que no desaparecen con agua potable ? Tu agua se rompe. A veces es un gran chorro de lquido, a veces es solo un goteo que sigue mojando tu ropa interior o corriendo por tus piernas. ? Tiene sangrado vaginal. ? No siente que su beb se mueva normalmente. Si no lo hace, busque algo de comer y beber, acustese y concntrese en sentir cmo se mueve su beb. Si su beb todava no se mueve con normalidad, debe ir a MAU.

## 2021-09-29 ENCOUNTER — Other Ambulatory Visit (HOSPITAL_COMMUNITY)
Admission: RE | Admit: 2021-09-29 | Discharge: 2021-09-29 | Disposition: A | Discharge status: Home / Self Care | Payer: Self-pay | Source: Ambulatory Visit | Attending: Family Medicine | Admitting: Family Medicine

## 2021-09-29 ENCOUNTER — Other Ambulatory Visit: Payer: Self-pay

## 2021-09-29 ENCOUNTER — Ambulatory Visit (INDEPENDENT_AMBULATORY_CARE_PROVIDER_SITE_OTHER): Payer: Self-pay | Admitting: Family Medicine

## 2021-09-29 VITALS — BP 104/72 | HR 84 | Wt 210.2 lb

## 2021-09-29 DIAGNOSIS — Z0000 Encounter for general adult medical examination without abnormal findings: Secondary | ICD-10-CM

## 2021-09-29 DIAGNOSIS — Z3A15 15 weeks gestation of pregnancy: Secondary | ICD-10-CM | POA: Insufficient documentation

## 2021-09-29 DIAGNOSIS — O0991 Supervision of high risk pregnancy, unspecified, first trimester: Secondary | ICD-10-CM | POA: Insufficient documentation

## 2021-09-29 DIAGNOSIS — O99012 Anemia complicating pregnancy, second trimester: Secondary | ICD-10-CM | POA: Insufficient documentation

## 2021-09-29 DIAGNOSIS — N904 Leukoplakia of vulva: Secondary | ICD-10-CM | POA: Insufficient documentation

## 2021-09-29 DIAGNOSIS — O26892 Other specified pregnancy related conditions, second trimester: Secondary | ICD-10-CM | POA: Insufficient documentation

## 2021-09-29 DIAGNOSIS — Z3491 Encounter for supervision of normal pregnancy, unspecified, first trimester: Secondary | ICD-10-CM

## 2021-09-29 DIAGNOSIS — O24419 Gestational diabetes mellitus in pregnancy, unspecified control: Secondary | ICD-10-CM

## 2021-09-29 DIAGNOSIS — D573 Sickle-cell trait: Secondary | ICD-10-CM | POA: Insufficient documentation

## 2021-09-29 DIAGNOSIS — Z148 Genetic carrier of other disease: Secondary | ICD-10-CM | POA: Insufficient documentation

## 2021-09-29 LAB — POCT 1 HR PRENATAL GLUCOSE: Glucose 1 Hr Prenatal, POC: 244 mg/dL

## 2021-09-29 MED ORDER — DOXYLAMINE SUCCINATE (SLEEP) 25 MG PO TABS: 25.0000 mg | ORAL_TABLET | Freq: Every day | ORAL | 0 refills | Status: DC | PRN

## 2021-09-29 MED ORDER — TRIAMCINOLONE ACETONIDE 0.1 % EX CREA: 1.0000 "application " | TOPICAL_CREAM | Freq: Two times a day (BID) | CUTANEOUS | 0 refills | Status: DC

## 2021-09-29 MED ORDER — ASPIRIN EC 81 MG PO TBEC: 81.0000 mg | DELAYED_RELEASE_TABLET | Freq: Every day | ORAL | 11 refills | Status: DC

## 2021-09-29 NOTE — Patient Instructions (Signed)
° ° °  Gracias por venir a verme hoy. Fue Psychiatrist. Hoy hablamos de la lesin en la regin genital. Recomiendo crema con esteroides en el rea afectada dos veces al da. No aplicar sobre piel sana. Haga un seguimiento con nosotros si no mejora, ya que es posible que necesitemos una biopsia.  Remitirlo a gentica para la deteccin de la enfermedad de clulas falciformes en la pareja. Te llamarn para una cita.  Tiene diabetes gestacional: la remitir a Radio producer de Conservator, museum/gallery. Te llamarn para una cita.  Comience con aspirina de inmediato   Vaya a MAU en [facility] en [place] si: ? Tiene calambres/contracciones que no desaparecen con agua potable ? Tu agua se rompe. A veces es un gran chorro de lquido, a veces es solo un goteo que sigue mojando tu ropa interior o corriendo por tus piernas. ? Tiene sangrado vaginal. ? No siente que su beb se mueva normalmente. Si no lo hace, busque algo de comer y beber, acustese y concntrese en sentir cmo se mueve su beb. Si su beb todava no se mueve con normalidad, debe ir a MAU.  Seguimiento segn sea necesario  Si tiene alguna pregunta o inquietud, no dude en llamar a la oficina al [phone].  Los mejores deseos,  Dr Allena Katz    Thank you for coming to see me today. It was a pleasure. Today we discussed the lesion on the genital region. I recommend steroid cream to the affected area twice a day. Do not apply to healthy skin. Follow up with [place] if it does not improve as we may need to biopsy it.  Referring you to genetics for partner screening for sickle cell disease. They will call you for an appointment   You have gestational diabetes-I am referring you to high risk OB. They will call you for an appointment   Start aspirin right away   Go to the MAU at [facility] at [facility] if: You have cramping/contractions that do not go away with drinking water Your water breaks.  Sometimes it is a big gush  of fluid, sometimes it is just a trickle that keeps getting your underwear wet or running down your legs You have vaginal bleeding.    You do not feel your baby moving like normal.  If you do not, get something to eat and drink and lay down and focus on feeling your baby move. If your baby is still not moving like normal, you should go to MAU.   Follow up as needed  If you have any questions or concerns, please do not hesitate to call the office at [phone].  Best wishes,   Dr Allena Katz

## 2021-09-29 NOTE — Progress Notes (Signed)
° ° ° °  SUBJECTIVE:   CHIEF COMPLAINT / HPI:   Kristen Harvey is a 36 y.o. female presents for OB visit  Most of initial OB visit was covered a few days ago but due to time constraints unable to complete this visit  Denies contractions, fluid leakage or vaginal bleeding. She does endorse nausea and vomiting.   Pt presents for pap smear.   FOB not present today.  Flowsheet Row Routine Prenatal from 09/29/2021 in [facility]  PHQ-9 Total Score 0        Health Maintenance Due  Topic   TETANUS/TDAP    PAP SMEAR-Modifier      PERTINENT  PMH / PSH: none  OBJECTIVE:   BP 104/72    Pulse 84    Wt 210 lb 4 oz (95.4 kg)    LMP 06/10/2021    General: Alert, no acute distress Cardio: well perfused  Pulm: normal work of breathing Neuro: Cranial nerves grossly intact    Pelvic Exam chaperoned by CMA Tashira         External: normal female genitalia without lesions or masses        Vagina: normal without lesions or masses        Cervix: normal without lesions or masses        Pap smear: performed  Irregular white patch over right side of vulva as seen below      ASSESSMENT/PLAN:   Lichen sclerosus of female genitalia Likely lichen sclerosis, possibly flared by pregnancy. Recommended triamcinolone ointment BID to affected area. If no improvement within a few weeks recommended f/u in derm clinic.  Sickle cell trait (HCC) Referred to genetics for further counseling and for genetic testing of FOB.   Gestational diabetes mellitus Early GTT with glucose 244. Dx with gestational diabetes Discussed risks of GDM. Referred to high risk OB.   Supervision of high risk pregnancy in first trimester Obtained pap and quad screen today. Recommended pt starts ASA 81mg  due to risk of pre-clampsia/hypertensive disorders of pregnancy. Also sent in prescription of Unisom to pharmacy for nausea/vomiting.     , MD PGY-3 [facility]

## 2021-09-30 DIAGNOSIS — Z0000 Encounter for general adult medical examination without abnormal findings: Secondary | ICD-10-CM | POA: Insufficient documentation

## 2021-09-30 DIAGNOSIS — N904 Leukoplakia of vulva: Secondary | ICD-10-CM | POA: Insufficient documentation

## 2021-09-30 DIAGNOSIS — O24419 Gestational diabetes mellitus in pregnancy, unspecified control: Secondary | ICD-10-CM | POA: Insufficient documentation

## 2021-09-30 DIAGNOSIS — D573 Sickle-cell trait: Secondary | ICD-10-CM | POA: Insufficient documentation

## 2021-09-30 DIAGNOSIS — O0991 Supervision of high risk pregnancy, unspecified, first trimester: Secondary | ICD-10-CM | POA: Insufficient documentation

## 2021-09-30 LAB — CYTOLOGY - PAP
Comment: NEGATIVE
Diagnosis: NEGATIVE
High risk HPV: NEGATIVE

## 2021-09-30 NOTE — Assessment & Plan Note (Addendum)
Obtained pap and quad screen today. Recommended pt starts ASA 81mg  due to risk of pre-clampsia/hypertensive disorders of pregnancy. Also sent in prescription of Unisom to pharmacy for nausea/vomiting.

## 2021-09-30 NOTE — Assessment & Plan Note (Signed)
Likely lichen sclerosis, possibly flared by pregnancy. Recommended triamcinolone ointment BID to affected area. If no improvement within a few weeks recommended f/u in derm clinic.

## 2021-09-30 NOTE — Assessment & Plan Note (Signed)
Referred to genetics for further counseling and for genetic testing of FOB.

## 2021-09-30 NOTE — Assessment & Plan Note (Signed)
Early GTT with glucose 244. Dx with gestational diabetes Discussed risks of GDM. Referred to high risk OB.

## 2021-10-01 LAB — AFP TETRA
DIA Mom Value: 0.86
DIA Value (EIA): 122.04 pg/mL
DSR (By Age)    1 IN: 353
DSR (Second Trimester) 1 IN: 6857
Gestational Age: 15 WEEKS
MSAFP Mom: 1.26
MSAFP: 31.5 ng/mL
MSHCG Mom: 0.83
MSHCG: 40505 m[IU]/mL
Maternal Age At EDD: 34.2 yr
Osb Risk: 5411
T18 (By Age): 1:1373 {titer}
Test Results:: NEGATIVE
Weight: 210 [lb_av]
uE3 Mom: 1.36
uE3 Value: 0.98 ng/mL

## 2021-10-01 LAB — VARICELLA ZOSTER ANTIBODY, IGG: Varicella zoster IgG: <135 index — ABNORMAL LOW (ref >165)

## 2021-10-04 ENCOUNTER — Other Ambulatory Visit: Payer: Self-pay | Admitting: Family Medicine

## 2021-10-04 ENCOUNTER — Telehealth: Payer: Self-pay

## 2021-10-04 MED ORDER — PYRIDOXINE HCL 25 MG PO TABS: 25.0000 mg | ORAL_TABLET | Freq: Every day | ORAL | 0 refills | Status: AC

## 2021-10-04 NOTE — Telephone Encounter (Signed)
Sure I have sent in b6 25mg  tablets to the pharmacy. I sent in unisom which is doxylamine as diclegis (doxylamine/b6 combod) is too expensive. Please could you let the patient know? Please note she has been referred to high risk Ob for the rest of her pre-natal care. Thank you.

## 2021-10-04 NOTE — Telephone Encounter (Signed)
Patient calls nurse line regarding concerns with Unisom. Patient was told that this medication was a sleep aide and wasn't sure exactly how she should be taking it.   Spoke with Dr. Thompson Grayer regarding Unisom. Recommended forwarding to Dr. Posey Pronto to send in rx for vitamin B6 as well.   Please advise.   Talbot Grumbling, RN

## 2021-10-04 NOTE — Telephone Encounter (Signed)
Called patient with [place] interpreter. Informed of below. All questions answered.   Talbot Grumbling, RN

## 2021-10-13 ENCOUNTER — Other Ambulatory Visit: Payer: Self-pay

## 2021-10-13 ENCOUNTER — Ambulatory Visit (INDEPENDENT_AMBULATORY_CARE_PROVIDER_SITE_OTHER): Payer: Self-pay

## 2021-10-13 DIAGNOSIS — O0991 Supervision of high risk pregnancy, unspecified, first trimester: Secondary | ICD-10-CM

## 2021-10-13 NOTE — Progress Notes (Signed)
New OB Intake  Here today for New OB Intake. Tranferring care from [facility] due to early GDM. We discussed her EDD of 03/17/22 that is based on LMP of 06/10/21. Pt is G3/P2002. I reviewed her allergies, medications, Medical/Surgical/OB history, and appropriate screenings. I informed her of [facility] services. Based on history, this pregnancy is complicated by gestational diabetes.  Patient Active Problem List   Diagnosis Date Noted   Lichen sclerosus of female genitalia 09/30/2021   Sickle cell trait (HCC) 09/30/2021   Gestational diabetes mellitus 09/30/2021   Supervision of high risk pregnancy in first trimester 09/30/2021   Concerns addressed today Patient has older child that just arrived from [place]. Needs pediatrician for this child and new baby. Warm hand-off completed with prenatal navigator Renea Ee for assistance with pediatrician, [facility] Financial Assistance application, and Architect.  Delivery Plans:  Plans to deliver at [facility].   MyChart MyChart downloaded and account created with patient.  Anatomy US Patient is enrolled in [facility]-a-mom. Will be scheduled at [facility] once March schedule opens.   Labs Routine labs drawn at [facility] new OB.   COVID Vaccine Patient has one dose Pfizer, will return to [facility] on 10/17/21 for second dose.   Is patient a CenteringPregnancy candidate? Not a candidate   Is patient a Mom+Baby Combined Care candidate? Not a candidate     Social Determinants of Health Food Insecurity: Patient expresses food insecurity. Food Market information given to patient; explained patient may visit at the end of first OB appointment. Transportation: Patient denies transportation needs. Childcare: Discussed no children allowed at ultrasound appointments. Offered childcare services; patient declines childcare services at this time.  First visit review I reviewed new OB appt with pt. I explained she will have a provider visit with  Merian Capron, MD; encounter routed to appropriate provider.   Marjo Bicker, RN 10/14/2021  12:24 PM

## 2021-10-19 ENCOUNTER — Telehealth: Payer: Self-pay

## 2021-10-19 NOTE — Telephone Encounter (Signed)
Anatomy US scheduled with [facility] [place] for 10/24/21 at 1 PM. Pt notified by prenatal navigator [place]. MFM appt cancelled.  ?

## 2021-10-21 ENCOUNTER — Ambulatory Visit: Payer: Self-pay

## 2021-11-01 ENCOUNTER — Ambulatory Visit (INDEPENDENT_AMBULATORY_CARE_PROVIDER_SITE_OTHER): Payer: Self-pay | Admitting: Family Medicine

## 2021-11-01 ENCOUNTER — Other Ambulatory Visit: Payer: Self-pay

## 2021-11-01 ENCOUNTER — Encounter: Payer: Self-pay | Admitting: Family Medicine

## 2021-11-01 VITALS — BP 114/75 | HR 81 | Wt 213.1 lb

## 2021-11-01 DIAGNOSIS — N904 Leukoplakia of vulva: Secondary | ICD-10-CM

## 2021-11-01 DIAGNOSIS — O0991 Supervision of high risk pregnancy, unspecified, first trimester: Secondary | ICD-10-CM

## 2021-11-01 DIAGNOSIS — O24419 Gestational diabetes mellitus in pregnancy, unspecified control: Secondary | ICD-10-CM

## 2021-11-01 DIAGNOSIS — D573 Sickle-cell trait: Secondary | ICD-10-CM

## 2021-11-01 NOTE — Progress Notes (Signed)
?  ? ?Subjective:  ? ?Kristen Harvey is a 36 y.o. G3P2002 at 38w4dby LMP, early ultrasound being seen today for her first obstetrical visit.  Her obstetrical history is significant for  T2DM . Patient does intend to breast feed. Pregnancy history fully reviewed. ? ?[facility]  ?Had GDM in second pregnancy.  ? ?Patient reports no complaints. ? ?HISTORY: ?OB History  ?Gravida Para Term Preterm AB Living  ?3 2 2  0 0 2  ?SAB IAB Ectopic Multiple Live Births  ?0 0 0 0 2  ?  ?# Outcome Date GA Lbr Len/2nd Weight Sex Delivery Anes PTL Lv  ?3 Current           ?2 Term 04/30/17   7 lb (3.175 kg) F Vag-Spont   LIV  ?1 Term 05/31/11   7 lb (3.175 kg) M Vag-Spont   LIV  ?  ? ?Last pap smear: ?Lab Results  ?Component Value Date  ? DIAGPAP  09/29/2021  ?  - Negative for intraepithelial lesion or malignancy (NILM)  ? [place] 09/29/2021  ? ? ? ?Past Medical History:  ?Diagnosis Date  ? Fatty liver   ? Gastritis   ? ?History reviewed. No pertinent surgical history. ?Family History  ?Problem Relation Age of Onset  ? Hypertension Mother   ? Diabetes Mother   ? Hypertension Father   ? Diabetes Father   ? ?Social History  ? ?Tobacco Use  ? Smoking status: Never  ? Smokeless tobacco: Never  ?Substance Use Topics  ? Alcohol use: Not Currently  ? Drug use: Never  ? ?No Known Allergies ?Current Outpatient Medications on File Prior to Visit  ?Medication Sig Dispense Refill  ? aspirin EC 81 MG tablet Take 1 tablet (81 mg total) by mouth daily. Swallow whole. 30 tablet 11  ? doxylamine, Sleep, (UNISOM) 25 MG tablet Take 1 tablet (25 mg total) by mouth daily as needed. 30 tablet 0  ? Prenatal Vit-Fe Fumarate-FA (MULTIVITAMIN-PRENATAL) 27-0.8 MG TABS tablet Take 1 tablet by mouth daily at 12 noon.    ? pyridOXINE (VITAMIN B-6) 25 MG tablet Take 1 tablet (25 mg total) by mouth daily. (Patient not taking: Reported on 11/01/2021) 30 tablet 0  ? ?No current facility-administered medications on  file prior to visit.  ? ? ? ?Exam  ? ?Vitals:  ? 11/01/21 1521  ?BP: 114/75  ?Pulse: 81  ?Weight: 213 lb 1.6 oz (96.7 kg)  ? ?Fetal Heart Rate (bpm): 147 ? ?System: General: well-developed, well-nourished female in no acute distress  ? Skin: normal coloration and turgor, no rashes  ? Neurologic: oriented, normal, negative, normal mood  ? Extremities: normal strength, tone, and muscle mass, ROM of all joints is normal  ? HEENT PERRLA, extraocular movement intact and sclera clear, anicteric  ? Neck supple and no masses  ? Respiratory:  no respiratory distress  ? ? ?  ?Assessment:  ? ?Pregnancy: [id number]?Patient Active Problem List  ? Diagnosis Date Noted  ? Lichen sclerosus of female genitalia 09/30/2021  ? Sickle cell trait ([place] 09/30/2021  ? Gestational diabetes mellitus 09/30/2021  ? Supervision of high risk pregnancy in first trimester 09/30/2021  ? ?  ?Plan:  ?1. Supervision of high risk pregnancy in first trimester ?[facility] ?BP and FHR normal ?Labs reviewed, with exception of GTT have been normal to date ?Problem list reviewed and updated. ?The nature of  [place] with multiple MDs and other Advanced Practice Providers was explained to patient; also emphasized that residents, students are part of our team. ? ?2. Gestational diabetes mellitus (GDM) in second trimester, gestational diabetes method of control unspecified ?Presumed based on markedly elevated 1hr GTT value of 244 ?Reports prior hx in second pregnancy ?Scheduled to see DM educator on 11/03/2021 ?Given elevated value early in pregnancy at this point strong suspicion for T2DM ?Will get A1c, baseline CMP and UPCR, and refer for fetal echo ?Had [facility] [place] on 10/24/2021, four chamber heart not seen but report states remainder of exam is normal ?Given high risk prefer she go to MFM despite being Adopt a Mom, will have her fill out [place] financial aid  application ? ?3. Lichen sclerosus of female genitalia ?Defer biopsy to PP period ? ?4. Sickle cell trait ([place]) ?Discussed at length, reassured patient low probability overall ?Assisted with compassionate care program with [place] while in room ?She will send partner kit when he returns from [place] ? ?Routine obstetric precautions reviewed. ?Return in 2 weeks (on 11/15/2021) for [facility], ob visit, needs MD, review sugar log. ? ?  ? ?

## 2021-11-01 NOTE — Patient Instructions (Addendum)
Segundo trimestre de embarazo ?Second Trimester of Pregnancy ?El segundo trimestre de embarazo va desde la semana 13 hasta la semana 27. Es decir desde el mes 4 hasta el mes 6 de embarazo. El segundo trimestre suele ser el momento en el que mejor se siente. Su organismo se ha adaptado a estar embarazada, y comienza a sentirse f?sicamente mejor. ?Durante el segundo trimestre: ?Las n?useas del embarazo han disminuido o han desaparecido completamente. ?Usted puede tener m?s energ?a. ?Es posible que tenga un aumento del apetito. ?El segundo trimestre es tambi?n un per?odo en el que el beb? en gestaci?n (feto) crece r?pidamente. Hacia el final del sexto mes, el feto puede medir aproximadamente 12 pulgadas y pesar alrededor de 1? libras. Es probable que sienta que el beb? se mueve (da pataditas) entre las 16 y 20?semanas del embarazo. ?Cambios en el cuerpo durante el segundo trimestre ?Su cuerpo continua experimentando numerosos cambios durante su segundo trimestre. Los cambios var?an y generalmente vuelven a la normalidad despu?s del nacimiento del beb?. ?Cambios f?sicos ?Seguir? aumentando de peso. Notar? que la parte baja del abdomen sobresale. ?Podr?n aparecer las primeras estr?as en las caderas, el abdomen y las mamas. ?Las mamas seguir?n creciendo y se tornar?n sensibles. ?Pueden aparecer zonas oscuras o manchas (cloasma o m?scara del embarazo) en el rostro. ?Es posible que se forme una l?nea oscura desde el ombligo hasta la zona del pubis (linea nigra). ?Tal vez haya cambios en el cabello. Esto cambios pueden incluir su engrosamiento, crecimiento r?pido y cambios en la textura. A algunas personas tambi?n se les cae el cabello durante o despu?s del embarazo, o tienen el cabello seco o fino. ?Cambios en la salud ?Comienza a tener dolores de cabeza. ?Es posible que tenga acidez estomacal. ?Puede tener estre?imiento. ?Pueden aparecer hemorroides o abultarse e hincharse las venas (venas varicosas). ?Las enc?as pueden  sangrar y estar sensibles al cepillado y al hilo dental. ?Tal vez tenga necesidad de orinar con m?s frecuencia porque el feto est? ejerciendo presi?n sobre la vejiga. ?Puede sentir dolor en la espalda. Esto se debe a: ?Aumento de peso. ?Las hormonas del embarazo relajan las articulaciones en la pelvis. ?Un cambio en el peso y los m?sculos que ayudan a mantener su equilibrio. ?Siga estas instrucciones en su casa: ?Medicamentos ?Siga las instrucciones del m?dico en relaci?n con el uso de medicamentos. Durante el embarazo, hay medicamentos que pueden tomarse y otros que no. No tome medicamentos a menos que lo haya autorizado el m?dico. ?Tome vitaminas prenatales que contengan por lo menos 600?microgramos (mcg) de ?cido f?lico. ?Comida y bebida ?Lleve una dieta saludable que incluya frutas y verduras frescas, cereales integrales, buenas fuentes de prote?nas como carnes magras, huevos o tofu, y productos l?cteos descremados. ?Evite la carne cruda y el jugo, la leche y el queso sin pasteurizar. Estos portan g?rmenes que pueden provocar da?o tanto a usted como al beb?. ?Es posible que tenga que tomar estas medidas para prevenir o tratar el estre?imiento: ?Beber suficiente l?quido como para mantener la orina de color amarillo p?lido. ?Consumir alimentos ricos en fibra, como frijoles, cereales integrales, y frutas y verduras frescas. ?Limitar el consumo de alimentos ricos en grasa y az?cares procesados, como los alimentos fritos o dulces. ?Actividad ?Haga ejercicio solamente como se lo haya indicado el m?dico. La mayor?a de las personas pueden continuar su rutina de ejercicios durante el embarazo. Intente realizar como m?nimo 30?minutos de actividad f?sica por lo menos 5?d?as a la semana. Deje de hacer ejercicio si comienza a tener contracciones en el ?  tero. ?Deje de hacer ejercicio si le aparecen dolor o c?licos en la parte baja del vientre o de la espalda. ?Evite hacer ejercicio si hace mucho calor o humedad, o si se  encuentra a una altitud elevada. ?Evite levantar pesos excesivos. ?Si lo desea, puede seguir teniendo relaciones sexuales, salvo que el m?dico le indique lo contrario. ?Alivio del dolor y del malestar ?Use un sujetador que le brinde buen soporte para prevenir las molestias causadas por la sensibilidad en las mamas. ?Dese ba?os de asiento con agua tibia para aliviar el dolor o las molestias causadas por las hemorroides. Use una crema para las hemorroides si el m?dico la autoriza. ?Descanse con las piernas levantadas (elevadas) si tiene calambres en las piernas o dolor en la parte baja de la espalda. ?Si tiene venas varicosas: ?Use medias de compresi?n como se lo haya indicado el m?dico. ?Eleve los pies durante 15?minutos, 3 o 4?veces por d?a. ?Limite el consumo de sal en su dieta. ?Seguridad ?Use el cintur?n de seguridad en todo momento mientras conduce o va en auto. ?Hable con el m?dico si es v?ctima de maltrato verbal o f?sico. ?Estilo de vida ?No se d? ba?os de inmersi?n en agua caliente, ba?os turcos ni saunas. ?No se haga duchas vaginales. No use tampones ni toallas higi?nicas perfumadas. ?Evite el contacto con las bandejas sanitarias de los gatos y la tierra que estos animales usan. Estos elementos contienen bacterias que pueden causar defectos cong?nitos al beb? y la posible p?rdida del feto debido a un aborto espont?neo o muerte fetal. ?No use remedios a base de hierbas, alcohol, drogas ilegales ni medicamentos que no est?n aprobados por el m?dico. Las sustancias qu?micas de estos productos pueden da?ar al beb?. ?No consuma ning?n producto que contenga nicotina o tabaco, como cigarrillos, cigarrillos electr?nicos y tabaco de mascar. Si necesita ayuda para dejar de fumar, consulte al m?dico. ?Instrucciones generales ?Durante una visita prenatal de rutina, el m?dico le har? un examen f?sico y otras pruebas. Tambi?n le hablar? sobre su salud general. Cumpla con todas las visitas de seguimiento. Esto es  importante. ?P?dale al m?dico que la derive a clases de educaci?n prenatal en su localidad. ?Pida ayuda si tiene necesidades nutricionales o de asesoramiento durante el embarazo. El m?dico puede aconsejarla o derivarla a especialistas para que la ayuden con diferentes necesidades. ?D?nde buscar m?s informaci?n ?American Pregnancy Association (Asociaci?n Americana del Embarazo): americanpregnancy.org ?American College of Obstetricians and Gynecologists (Colegio Estadounidense de Obstetras y Ginec?logos): acog.org/womens-health/pregnancy? ?Office on Women's Health (Oficina para la Salud de la Mujer): womenshealth.gov/pregnancy ?Comun?quese con un m?dico si tiene: ?Un dolor de cabeza que no desaparece despu?s de tomar analg?sicos. ?Cambios en la visi?n o ve manchas delante de los ojos. ?C?licos leves, presi?n en la pelvis o dolor persistente en el abdomen. ?N?useas persistentes, v?mitos o diarrea. ?Secreci?n vaginal con mal olor u orina con olor f?tido. ?Dolor al orinar. ?Hinchaz?n s?bita o extrema del rostro, las manos, los tobillos, los pies o las piernas. ?Fiebre. ?Busque ayuda de inmediato si: ?Tiene una p?rdida de l?quido por la vagina. ?Tiene sangrado ligero o manchas vaginales. ?Tiene dolor intenso o c?licos en el abdomen. ?Presenta dificultad para respirar. ?Siente dolor en el pecho. ?Tiene episodios de desmayo. ?No ha sentido a su beb? moverse durante el per?odo de tiempo que le indic? el m?dico. ?Tiene dolor, hinchaz?n o enrojecimiento nuevos o m?s intensos en un brazo o una pierna. ?Resumen ?El segundo trimestre de embarazo va desde la semana 13 hasta la 27 (desde el mes 4 hasta el   6). ?No use remedios a base de hierbas, alcohol, drogas ilegales ni medicamentos que no est?n aprobados por el m?dico. Las sustancias qu?micas de estos productos pueden da?ar al beb?. ?Haga ejercicio solamente como se lo haya indicado el m?dico. La mayor?a de las personas pueden continuar su rutina de ejercicios durante el  embarazo. ?Cumpla con todas las visitas de seguimiento. Esto es importante. ?Esta informaci?n no tiene como fin reemplazar el consejo del m?dico. Aseg?rese de hacerle al m?dico cualquier pregunta que tenga. ?Document Revise

## 2021-11-02 LAB — COMPREHENSIVE METABOLIC PANEL
ALT: 11 IU/L (ref 0–32)
AST: 12 IU/L (ref 0–40)
Albumin/Globulin Ratio: 1.3 (ref 1.2–2.2)
Albumin: 4 g/dL (ref 3.8–4.8)
Alkaline Phosphatase: 69 IU/L (ref 44–121)
BUN/Creatinine Ratio: 7 — ABNORMAL LOW (ref 9–23)
BUN: 4 mg/dL — ABNORMAL LOW (ref 6–20)
Bilirubin Total: 0.3 mg/dL (ref 0.0–1.2)
CO2: 19 mmol/L — ABNORMAL LOW (ref 20–29)
Calcium: 9.3 mg/dL (ref 8.7–10.2)
Chloride: 104 mmol/L (ref 96–106)
Creatinine, Ser: 0.56 mg/dL — ABNORMAL LOW (ref 0.57–1.00)
Globulin, Total: 3.1 g/dL (ref 1.5–4.5)
Glucose: 75 mg/dL (ref 70–99)
Potassium: 3.8 mmol/L (ref 3.5–5.2)
Sodium: 136 mmol/L (ref 134–144)
Total Protein: 7.1 g/dL (ref 6.0–8.5)
eGFR: 124 mL/min/{1.73_m2} (ref >59)

## 2021-11-02 LAB — HEMOGLOBIN A1C
Est. average glucose Bld gHb Est-mCnc: 117 mg/dL
Hgb A1c MFr Bld: 5.7 % — ABNORMAL HIGH (ref 4.8–5.6)

## 2021-11-02 LAB — PROTEIN / CREATININE RATIO, URINE
Creatinine, Urine: 147.6 mg/dL
Protein, Ur: 10.2 mg/dL
Protein/Creat Ratio: 69 mg/g creat (ref 0–200)

## 2021-11-03 ENCOUNTER — Ambulatory Visit (INDEPENDENT_AMBULATORY_CARE_PROVIDER_SITE_OTHER): Payer: Self-pay | Admitting: Registered"

## 2021-11-03 ENCOUNTER — Encounter: Payer: Self-pay | Attending: Family Medicine | Admitting: Registered"

## 2021-11-03 ENCOUNTER — Other Ambulatory Visit: Payer: Self-pay

## 2021-11-03 DIAGNOSIS — O24419 Gestational diabetes mellitus in pregnancy, unspecified control: Secondary | ICD-10-CM

## 2021-11-03 DIAGNOSIS — Z029 Encounter for administrative examinations, unspecified: Secondary | ICD-10-CM | POA: Insufficient documentation

## 2021-11-03 NOTE — Progress Notes (Signed)
[facility] video Interpreter [place] [phone] (video cut off) ?In-person interpreter Raquel ? ?Patient was seen for Gestational Diabetes self-management on 11/03/21  ?Start time 1420 and End time 1520  ? ?Estimated due date: 03/17/22; [redacted]w[redacted]d ? ?Clinical: ?Medications: reviewed ?Medical History: prior GDM  ?Labs: 1 hr GTT 244, A1c 5.7%  ? ?Dietary and Lifestyle History: ?Not much history gathered. Only enough time to teach how to use glucometer, and explain the diagnosis.  ? ?Patient was very concerned about understanding diagnosis and wanted a lot of detail. Pt states she was told that she probably has T2D she wanted to know what the numbers on the lab mean. GTT of 244 was the only lab available when Pt was told it might be T2D. Discussed difference between GTT test and A1c. ? ?No time for dietary counseling but patient was provided Spanish GDM handout with meal plan and encouraged pt to return for more education. ? ?Physical Activity: not assessed ?Stress: not assessed ?Sleep: not assessed ? ?24 hr Recall: not assessed ? ? ?NUTRITION INTERVENTION  ?Nutrition education (E-1) on the following topics:  ? ?Initial Follow-up ? ?[x]  []  Definition of Gestational Diabetes ?[]  []  Why dietary management is important in controlling blood glucose ?[]  []  Effects each nutrient has on blood glucose levels ?[]  []  Simple carbohydrates vs complex carbohydrates ?[]  []  Fluid intake ?[]  []  Creating a balanced meal plan ?[]  []  Carbohydrate counting  ?[x]  []  When to check blood glucose levels ?[x]  []  Proper blood glucose monitoring techniques ?[]  []  Effect of stress and stress reduction techniques  ?[]  []  Exercise effect on blood glucose levels, appropriate exercise during pregnancy ?[]  []  Importance of limiting caffeine and abstaining from alcohol and smoking ?[]  []  Medications used for blood sugar control during pregnancy ?[]  []  Hypoglycemia and rule of 15 ?[]  []  Postpartum self care ? ?Blood glucose monitor given:  ?Lot # [id number] ?CBG: 158 &  140 mg/dL - needed to check 2x for training. Variance is within FDA allowed 15%. Timing of finger stick was ~2.5 hrs after eating at [employer] with [employer] ? ?Patient instructed to monitor glucose levels: ?FBS: 60 - ? 95 mg/dL ?2 hour: ? 120 mg/dL ? ?Patient received handouts: ?Nutrition Diabetes and Pregnancy ?Carbohydrate Counting List ? ?Patient will be seen for follow-up as needed.  ?

## 2021-11-10 ENCOUNTER — Ambulatory Visit: Payer: Self-pay | Attending: Family Medicine

## 2021-11-10 ENCOUNTER — Other Ambulatory Visit: Payer: Self-pay

## 2021-11-10 ENCOUNTER — Ambulatory Visit: Payer: Self-pay | Admitting: *Deleted

## 2021-11-10 ENCOUNTER — Other Ambulatory Visit: Payer: Self-pay | Admitting: *Deleted

## 2021-11-10 VITALS — BP 105/61 | HR 71 | Ht 62.0 in

## 2021-11-10 DIAGNOSIS — O24410 Gestational diabetes mellitus in pregnancy, diet controlled: Secondary | ICD-10-CM

## 2021-11-10 DIAGNOSIS — O24419 Gestational diabetes mellitus in pregnancy, unspecified control: Secondary | ICD-10-CM

## 2021-11-10 DIAGNOSIS — E669 Obesity, unspecified: Secondary | ICD-10-CM

## 2021-11-10 DIAGNOSIS — O99212 Obesity complicating pregnancy, second trimester: Secondary | ICD-10-CM

## 2021-11-10 DIAGNOSIS — Z3A21 21 weeks gestation of pregnancy: Secondary | ICD-10-CM

## 2021-11-10 NOTE — Congregational Nurse Program (Signed)
?  Dept: [phone] ? ? ?Congregational Nurse Program Note ? ?Date of Encounter: 11/10/2021 ? ?Past Medical History: ?Past Medical History:  ?Diagnosis Date  ? Fatty liver   ? Gastritis   ? ? ?Encounter Details: ? CNP Questionnaire - 11/10/21 1845   ? ?  ? Questionnaire  ? Do you give verbal consent to treat you today? Yes   ? Location Patient Served  [place]   ? [employer] [place];Phone/Text/Email   ? Patient Status Immigrant   ? Insurance Unknown   ? Insurance Referral N/A   ? Medication N/A   ? Medical Provider Yes   ? Screening Referrals N/A   ? Medical Referral N/A   ? Medical Appointment Made N/A   ? Food N/A   ? Transportation N/A   ? Housing/Utilities N/A   ? Interpersonal Safety N/A   ? ED Visit Averted N/A   ? Life-Saving Intervention Made N/A   ? ?  ?  ? ?  ? ? ?Patient seen for baby clothing resources. Patient was given a bag of baby clothes and blankets. Patient to follow up if she in need of any other resources.  ? ?

## 2021-11-11 DIAGNOSIS — Z789 Other specified health status: Secondary | ICD-10-CM | POA: Insufficient documentation

## 2021-11-11 NOTE — Progress Notes (Signed)
? ?PRENATAL VISIT NOTE ? ?Subjective:  ?Kristen Harvey is a 36 y.o. G3P2002 at [redacted]w[redacted]d being seen today for ongoing prenatal care.  She is currently monitored for the following issues for this high-risk pregnancy and has Lichen sclerosus of female genitalia; Sickle cell trait ([place]); Gestational diabetes mellitus; Supervision of high risk pregnancy in first trimester; and Language barrier on their problem list. ? ?Patient reports no complaints.  Contractions: Not present. Vag. Bleeding: None.  Movement: Present. Denies leaking of fluid.  ? ?The following portions of the patient's history were reviewed and updated as appropriate: allergies, current medications, past family history, past medical history, past social history, past surgical history and problem list.  ? ?Objective:  ? ?Vitals:  ? 11/15/21 1611  ?BP: 122/80  ?Pulse: 93  ?Weight: 217 lb 6.4 oz (98.6 kg)  ? ? ?Fetal Status: Fetal Heart Rate (bpm): 152   Movement: Present    ? ?General:  Alert, oriented and cooperative. Patient is in no acute distress.  ?Skin: Skin is warm and dry. No rash noted.   ?Cardiovascular: Normal heart rate noted  ?Respiratory: Normal respiratory effort, no problems with respiration noted  ?Abdomen: Soft, gravid, appropriate for gestational age.  Pain/Pressure: Absent     ?Pelvic: Cervical exam deferred        ?Extremities: Normal range of motion.  Edema: None  ?Mental Status: Normal mood and affect. Normal behavior. Normal judgment and thought content.  ? ?Assessment and Plan:  ?Pregnancy: [id number] at [redacted]w[redacted]d ?1. Gestational diabetes mellitus (GDM) in second trimester, gestational diabetes method of control unspecified ?- Reviewed diagnosis of GDM vs T2DM cannot be determined defintively until no longer pregnant ?- Discussed the risks associated in pregnancy especially with poor control including but not limited to increased risk of preeclampsia, macrosomia, need for operative delivery I.e. vacuum, forcep, c-section,  shoulder dystocia and resulting potential nerve injury.  ?- Discussed diet and exercise modifications. Diabetes education referral completed. Reviewed the importance of health weight gain and reviewed IOM recommendations.  ?- Counseled on importance of postpartum follow up testing to ensure resolution and ensure patient does not have ongoing DM ?- Discussed lifelong increased risk of DM and increased risk of GDM in future pregnancies ?- CBGs reviewed - she forgot her log but reports: fasting -  91, 96 120-121 but usually in high 90s to low 100s.  ?2 hr - 136 lowest, 140s typical (highest 146).  ?- Recommended starting therapy. Reviewed metformin and insulin and the pros/cons of each. Reviewed safety data regarding both and relative effectiveness. She would like to do what I recommend and what is best for baby. I would thus recommend insulin. Will start NPH 10 units BID. I think this will sufficiently lower her meals since those are well not too high. She is being very cautious with diet. She has f/u with [facility] in 2 days - we reviewed [facility] will teach her administration of insulin so to fill it and bring it to her appointment.  ? ?2. Sickle cell trait ([place]) ?- Partner testing completed today.  ?- Has genetic counseling appt on 4/20 as well ? ?3. Supervision of high risk pregnancy in first trimester ?- Four chamber heart NV on anatomy scan with [place]. 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 Recommend repeat US due to risk of cardiac anomalies. She has appt on 4/20 (fetal echo with [facility]) ? ?4. Language barrier ?- Interpreter used throughout ? ?Preterm labor symptoms and general obstetric precautions including but not limited to vaginal bleeding, contractions,  leaking of fluid and fetal movement were reviewed in detail with the patient. ?Please refer to After Visit Summary for other counseling recommendations.  ? ?Return in about 4 weeks (around 12/13/2021) for OB VISIT, MD or APP. ? ?Future Appointments  ?Date Time Provider  [place]  ?11/17/2021  1:15 PM [facility]-EDUCATION [facility]  ?12/08/2021  2:15 PM [facility] NURSE [facility]  ?12/08/2021  2:30 PM [facility] GENETIC COUNSELING RM [facility]  ?12/08/2021  3:30 PM [facility] US2 [facility] [place]  ?12/16/2021 11:15 AM Radene Gunning, MD [facility]  ? ? ?Radene Gunning, MD ?

## 2021-11-15 ENCOUNTER — Ambulatory Visit (INDEPENDENT_AMBULATORY_CARE_PROVIDER_SITE_OTHER): Payer: Self-pay | Admitting: Obstetrics and Gynecology

## 2021-11-15 ENCOUNTER — Other Ambulatory Visit: Payer: Self-pay

## 2021-11-15 ENCOUNTER — Encounter: Payer: Self-pay | Admitting: Obstetrics and Gynecology

## 2021-11-15 VITALS — BP 122/80 | HR 93 | Wt 217.4 lb

## 2021-11-15 DIAGNOSIS — Z889 Allergy status to unspecified drugs, medicaments and biological substances status: Secondary | ICD-10-CM

## 2021-11-15 DIAGNOSIS — O24419 Gestational diabetes mellitus in pregnancy, unspecified control: Secondary | ICD-10-CM

## 2021-11-15 DIAGNOSIS — O0991 Supervision of high risk pregnancy, unspecified, first trimester: Secondary | ICD-10-CM

## 2021-11-15 DIAGNOSIS — D573 Sickle-cell trait: Secondary | ICD-10-CM

## 2021-11-15 DIAGNOSIS — Z789 Other specified health status: Secondary | ICD-10-CM

## 2021-11-15 MED ORDER — LORATADINE 10 MG PO TABS: 10.0000 mg | ORAL_TABLET | Freq: Every day | ORAL | 1 refills | Status: DC

## 2021-11-15 MED ORDER — INSULIN NPH (HUMAN) (ISOPHANE) 100 UNIT/ML ~~LOC~~ SUSP: 10.0000 [IU] | Freq: Two times a day (BID) | SUBCUTANEOUS | 3 refills | Status: DC

## 2021-11-15 MED ORDER — "INSULIN SYRINGE-NEEDLE U-100 31G X 5/16"" 0.3 ML MISC": 10.0000 [IU] | Freq: Two times a day (BID) | 4 refills | Status: DC

## 2021-11-17 ENCOUNTER — Encounter: Payer: Self-pay | Attending: Family Medicine | Admitting: Registered"

## 2021-11-17 ENCOUNTER — Ambulatory Visit (INDEPENDENT_AMBULATORY_CARE_PROVIDER_SITE_OTHER): Payer: Self-pay | Admitting: Registered"

## 2021-11-17 DIAGNOSIS — O24419 Gestational diabetes mellitus in pregnancy, unspecified control: Secondary | ICD-10-CM

## 2021-11-17 DIAGNOSIS — Z3A00 Weeks of gestation of pregnancy not specified: Secondary | ICD-10-CM | POA: Insufficient documentation

## 2021-11-17 DIAGNOSIS — Z713 Dietary counseling and surveillance: Secondary | ICD-10-CM | POA: Insufficient documentation

## 2021-11-17 NOTE — Progress Notes (Signed)
Insulin Instruction ?Start time: 1330 End time: 6045 ? ?Patient was seen on 11/17/21 for insulin instruction.  ?MD orders are: 10 units 2x daily before a meal ? ?The following learning objectives were met by the patient during this visit: ? ? Insulin Action of NPH insulins ? Reviewed syringe & vial including # units per syringe and vial ? Hygiene and storage ? Drawing up single using vials ?  Single dose ?   ? Rotation of Sites ? Hypoglycemia- symptoms, causes, treatment choices ? Record keeping and MD follow up ? ?Patient demonstrated understanding of insulin administration by return demonstration. ? ?Patient received the following handouts: ?Insulin Instruction Handout ?                                       ?Patient to start on insulin as Rx'd by MD ? ?Patient will be seen for follow-up in 1 weeks or as needed.  ?

## 2021-11-23 ENCOUNTER — Ambulatory Visit (INDEPENDENT_AMBULATORY_CARE_PROVIDER_SITE_OTHER): Payer: Self-pay | Admitting: Family Medicine

## 2021-11-23 VITALS — BP 112/77 | HR 96 | Wt 217.8 lb

## 2021-11-23 DIAGNOSIS — Z789 Other specified health status: Secondary | ICD-10-CM

## 2021-11-23 DIAGNOSIS — D573 Sickle-cell trait: Secondary | ICD-10-CM

## 2021-11-23 DIAGNOSIS — O24419 Gestational diabetes mellitus in pregnancy, unspecified control: Secondary | ICD-10-CM

## 2021-11-23 DIAGNOSIS — O0991 Supervision of high risk pregnancy, unspecified, first trimester: Secondary | ICD-10-CM

## 2021-11-23 MED ORDER — INSULIN REGULAR HUMAN 100 UNIT/ML IJ SOLN: 10.0000 [IU] | Freq: Two times a day (BID) | INTRAMUSCULAR | 11 refills | Status: DC

## 2021-11-23 NOTE — Progress Notes (Signed)
Subjective:  ?Kristen Harvey is a 36 y.o. G3P2002 at [redacted]w[redacted]d being seen today for ongoing prenatal care.  She is currently monitored for the following issues for this high-risk pregnancy and has Lichen sclerosus of female genitalia; Sickle cell trait ([place]); Gestational diabetes mellitus; Supervision of high risk pregnancy in first trimester; and Language barrier on their problem list. ? ?GDM: Patient taking NPH 10 units BID.  Reports no hypoglycemic episodes.  Tolerating medication well ?Fasting: 83-113 ?2hr PP: Breakfast 150s, lunch 160-200, Dinner 160-190 ? ?Patient reports no complaints.  Contractions: Not present. Vag. Bleeding: None.  Movement: Present. Denies leaking of fluid.  ? ?The following portions of the patient's history were reviewed and updated as appropriate: allergies, current medications, past family history, past medical history, past social history, past surgical history and problem list. Problem list updated. ? ?Objective:  ? ?Vitals:  ? 11/23/21 1523  ?BP: 112/77  ?Pulse: 96  ?Weight: 217 lb 12.8 oz (98.8 kg)  ? ? ?Fetal Status: Fetal Heart Rate (bpm): 148   Movement: Present    ? ?General:  Alert, oriented and cooperative. Patient is in no acute distress.  ?Skin: Skin is warm and dry. No rash noted.   ?Cardiovascular: Normal heart rate noted  ?Respiratory: Normal respiratory effort, no problems with respiration noted  ?Abdomen: Soft, gravid, appropriate for gestational age. Pain/Pressure: Absent     ?Pelvic: Vag. Bleeding: None     ?Cervical exam deferred        ?Extremities: Normal range of motion.  Edema: None  ?Mental Status: Normal mood and affect. Normal behavior. Normal judgment and thought content.  ? ?Urinalysis:     ? ?Assessment and Plan:  ?Pregnancy: [id number] at [redacted]w[redacted]d ? ?1. Supervision of high risk pregnancy in first trimester ?FHT normal ? ?2. Gestational diabetes mellitus (GDM) in second trimester, gestational diabetes method of control unspecified ?Patient is prediabetic  with diminished insulin control with GTT.  ?Increase NPH in AM to 25 units, keep NPH at 10 units before bed. ?Start Regular 10 units before breakfast and dinner ?Emphasized checking regularly ?Discussed importance of euglycemia - improved fetal outcomes, reduced risk of cesarean delivery, LGA, etc. ? ?3. Sickle cell trait (Kristen Harvey) ? ?4. Language barrier ?Interpreter used. ? ? ?Preterm labor symptoms and general obstetric precautions including but not limited to vaginal bleeding, contractions, leaking of fluid and fetal movement were reviewed in detail with the patient. ?Please refer to After Visit Summary for other counseling recommendations.  ?No follow-ups on file. ? ? ?Truett Mainland, DO ? ?

## 2021-12-08 ENCOUNTER — Ambulatory Visit: Payer: Self-pay

## 2021-12-08 ENCOUNTER — Ambulatory Visit (HOSPITAL_BASED_OUTPATIENT_CLINIC_OR_DEPARTMENT_OTHER): Payer: Self-pay

## 2021-12-08 ENCOUNTER — Ambulatory Visit: Payer: Self-pay | Admitting: Genetics

## 2021-12-08 ENCOUNTER — Ambulatory Visit: Payer: Self-pay | Admitting: *Deleted

## 2021-12-08 ENCOUNTER — Ambulatory Visit: Payer: Self-pay | Attending: Obstetrics and Gynecology

## 2021-12-08 VITALS — BP 110/68 | HR 84

## 2021-12-08 DIAGNOSIS — E669 Obesity, unspecified: Secondary | ICD-10-CM

## 2021-12-08 DIAGNOSIS — O99212 Obesity complicating pregnancy, second trimester: Secondary | ICD-10-CM | POA: Insufficient documentation

## 2021-12-08 DIAGNOSIS — D573 Sickle-cell trait: Secondary | ICD-10-CM | POA: Insufficient documentation

## 2021-12-08 DIAGNOSIS — O24414 Gestational diabetes mellitus in pregnancy, insulin controlled: Secondary | ICD-10-CM | POA: Insufficient documentation

## 2021-12-08 DIAGNOSIS — O24419 Gestational diabetes mellitus in pregnancy, unspecified control: Secondary | ICD-10-CM | POA: Insufficient documentation

## 2021-12-08 DIAGNOSIS — Z3A25 25 weeks gestation of pregnancy: Secondary | ICD-10-CM

## 2021-12-08 DIAGNOSIS — O285 Abnormal chromosomal and genetic finding on antenatal screening of mother: Secondary | ICD-10-CM

## 2021-12-09 ENCOUNTER — Other Ambulatory Visit: Payer: Self-pay | Admitting: *Deleted

## 2021-12-09 DIAGNOSIS — D573 Sickle-cell trait: Secondary | ICD-10-CM

## 2021-12-09 DIAGNOSIS — O24414 Gestational diabetes mellitus in pregnancy, insulin controlled: Secondary | ICD-10-CM

## 2021-12-09 NOTE — Progress Notes (Signed)
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   ?EDC: 03/17/2022 LMP: 06/10/2021 Referring Provider:  ?Kristen Feil, MD   ?EGA: [id number] Counselor: ?Kristen Righter MS, CGC  ?OB Hx: [id number] of Appointment: 12/08/2021  ?Accompanied by: ?Her son  ?Spanish interpreter (Wilhemena Durie Face to Face Time: 30 Minutes  ? ?Previous Testing Completed: ?CBC from 09/19/2021 reviewed. MCV within normal limits. It is unlikely that YPalmirais a beta thalassemia carrier or an alpha thalassemia carrier of the double-gene deletion. Individuals with a normal MCV may be single-gene deletion carriers, but it is unlikely that the current pregnancy would be affected with alpha or beta thalassemia major. ?Hemoglobin evaluation form 09/19/2021 reviewed. Kristen Harvey a Sickle Cell Trait Carrier (see genetic counseling portion of the note below). ?YLorellapreviously completed a quad screen in this pregnancy (available in Epic under the Media tab). The result is low risk for Down syndrome, Trisomy 18, and open neural tube defects. The quad screen can identify 75-80% of cases with Down syndrome, 60% of cases with Trisomy 18, and up to 80% of cases with open neural tube defects.  ? ?Medical History:  ?This is Kristen Harvey's 3rd pregnancy. She has two living children. ?Reports she takes prenatal vitamins. ?Personal history of gestational diabetes.  ?Denies personal history of high blood pressure, thyroid conditions, and seizures. ?Denies bleeding, infections, and fevers in this pregnancy. ?Denies using tobacco, alcohol, or street drugs in this pregnancy.  ? ?Family History: A pedigree was created and scanned into Epic under the Media tab. ?Maternal ethnicity reported as Hispanic and paternal ethnicity reported as Hispanic. ?Denies Ashkenazi Jewish ancestry. ?Family history not remarkable for consanguinity, individuals with birth defects, intellectual disability, autism spectrum disorder,  multiple spontaneous abortions, still births, or unexplained neonatal death.  ?   ?Genetic Counseling:  ? ?Carrier of Sickle Cell Disease. YDenishiais a carrier of sickle cell trait (Hemoglobin S: Hb A/S). Hemoglobin S is a structural hemoglobin variant that replaces one of the ?-chains of hemoglobin. It is caused by mutations in the HBB gene. Carriers of sickle cell trait typically do not have any clinical features, however, may experience sickling under extreme conditions. Individuals who carry sickle cell trait would have a 25% risk to have offspring affected with Sickle Cell Disease if their reproductive partner is also found to be a carrier for a ?-globin chain abnormality (recessive inheritance). ?Sickle Cell Disease caused by the homozygous HBB variant p.Glu6Val (Hb S/S), is the most common cause of Sickle Cell Disease in the [place] ?Other Sickle Cell Disorders caused by compound heterozygous HBB pathogenic variants includes Sickle-Hemoglobin C Disease (Hb S/C) and two types of Sickle ?-Thalassemia (Hb S/?+ and Hb S/??). ?Other ?-globin chain variants such as D-Punjab, O-Arab, and E also result in Sickle Cell Disorders when inherited with HbS.  ?Most individuals with Sickle Cell Disease are healthy at birth and become symptomatic later in infancy or childhood after fetal hemoglobin levels decrease. Symptoms include infants with spontaneous painful swelling of the hands/feet, recurrent episodes of severe pain with no other identified etiology, unexplained anemia not related to iron deficiency, pallor, jaundice, pneumococcal sepsis or meningitis, severe anemia with splenic enlargement, childhood stroke, etc.  ? ?Records indicate that the father of the pregnancy (Kristen Harvey [facility] a saliva sample [facility]. Unfortunately, Kristen Harvey's saliva sample was not labeled with his name or any identifying information and his test requisition did not have the appropriate [facility] kit  labels. [facility]  canceled Kristen Harvey's saliva sample because they could not verify that the saliva in the tube belonged to Kristen Harvey. Kristen Harvey reported to genetic counseling that Kristen Harvey submitted a second saliva sample on 11/15/2021 during one of Kristen Harvey's OB appointments. Unfortunately, as of 12/08/2021 the laboratory has not received the second saliva sample and if the sample were to arrive to the laboratory this late the sample would most likely have expired past when testing can be completed. We discussed with Kristen Harvey the option of having Kristen Harvey complete a third saliva sample which Kristen Harvey accepted. Kristen Harvey is going to have a saliva kit with instructions in Spanish sent to Kristen Harvey's address for Kristen Harvey to complete at his earliest convenience. Kristen Harvey was specifically instructed to have Kristen Harvey label his saliva tube appropriately before sending the kit to the laboratory via the prepaid [employer] shipping envelope. Results will take approximately 2-3 weeks once [facility] receives and accepts Kristen Harvey's saliva sample for testing.  ?  ? ?Thank you for sharing in the care of Kristen Harvey with [place].  ?Please do not hesitate to contact us if you have any questions. ? ?Kristen Righter, MS, CGC ?Certified Genetic Counselor ? ?

## 2021-12-12 NOTE — Progress Notes (Signed)
? ?  PRENATAL VISIT NOTE ? ?Subjective:  ?Kristen Harvey is a 36 y.o. G3P2002 at [redacted]w[redacted]d being seen today for ongoing prenatal care.  She is currently monitored for the following issues for this high-risk pregnancy and has Sickle cell trait ([place]); Gestational diabetes mellitus; Supervision of high risk pregnancy in first trimester; and Language barrier on their problem list. ? ?Patient reports no complaints.  Contractions: Not present. Vag. Bleeding: None.  Movement: Present. Denies leaking of fluid.  ? ?The following portions of the patient's history were reviewed and updated as appropriate: allergies, current medications, past family history, past medical history, past social history, past surgical history and problem list.  ? ?Objective:  ? ?Vitals:  ? 12/16/21 1131  ?BP: 112/79  ?Pulse: 92  ?Weight: 223 lb 9.6 oz (101.4 kg)  ? ? ?Fetal Status: Fetal Heart Rate (bpm): 146   Movement: Present    ? ?General:  Alert, oriented and cooperative. Patient is in no acute distress.  ?Skin: Skin is warm and dry. No rash noted.   ?Cardiovascular: Normal heart rate noted  ?Respiratory: Normal respiratory effort, no problems with respiration noted  ?Abdomen: Soft, gravid, appropriate for gestational age.  Pain/Pressure: Present     ?Pelvic: Cervical exam deferred        ?Extremities: Normal range of motion.  Edema: None  ?Mental Status: Normal mood and affect. Normal behavior. Normal judgment and thought content.  ? ?Assessment and Plan:  ?Pregnancy: G3P2002 at [redacted]w[redacted]d ?1. Supervision of high risk pregnancy in first trimester ?- 28 wk labs today  ?- Tdap through [facility] as well as second covid.  ?- Noted itching soles of feet - added on LFTs and bile acids. No prior history.  ? ?2. Language barrier ?In person interpreter used throughout ? ?3. Gestational diabetes mellitus (GDM) in second trimester, gestational diabetes method of control unspecified ?- NPH 25/10, Regular 10/10 >> Increase to NPH 25/20, Regular 14/10 ?- Next  growth 5/16. Prior growth was 89%ile on 4/20.  ?- Fetal echo coming up on 5/19 ? ?4. Sickle cell trait ([place]) ?S/p genetic counseling ?Partner testing repeated and he was negative. Pt informed.  ? ?Preterm labor symptoms and general obstetric precautions including but not limited to vaginal bleeding, contractions, leaking of fluid and fetal movement were reviewed in detail with the patient. ?Please refer to After Visit Summary for other counseling recommendations.  ? ?No follow-ups on file. ? ?Future Appointments  ?Date Time Provider [place]  ?12/23/2021  8:20 AM [facility]  ?01/03/2022  3:30 PM [facility] NURSE [facility]  ?01/03/2022  3:45 PM [facility] US6 [facility]  ?01/05/2022 10:55 AM Aletha Halim, MD [facility]  ? ? ?Radene Gunning, MD ?

## 2021-12-16 ENCOUNTER — Ambulatory Visit (INDEPENDENT_AMBULATORY_CARE_PROVIDER_SITE_OTHER): Payer: Self-pay | Admitting: Obstetrics and Gynecology

## 2021-12-16 ENCOUNTER — Other Ambulatory Visit: Payer: Self-pay

## 2021-12-16 VITALS — BP 112/79 | HR 92 | Wt 223.6 lb

## 2021-12-16 DIAGNOSIS — Z23 Encounter for immunization: Secondary | ICD-10-CM

## 2021-12-16 DIAGNOSIS — O24419 Gestational diabetes mellitus in pregnancy, unspecified control: Secondary | ICD-10-CM

## 2021-12-16 DIAGNOSIS — Z789 Other specified health status: Secondary | ICD-10-CM

## 2021-12-16 DIAGNOSIS — D573 Sickle-cell trait: Secondary | ICD-10-CM

## 2021-12-16 DIAGNOSIS — L299 Pruritus, unspecified: Secondary | ICD-10-CM

## 2021-12-16 DIAGNOSIS — O0991 Supervision of high risk pregnancy, unspecified, first trimester: Secondary | ICD-10-CM

## 2021-12-16 MED ORDER — "INSULIN SYRINGE-NEEDLE U-100 31G X 5/16"" 0.3 ML MISC"
10.0000 [IU] | Freq: Two times a day (BID) | 4 refills | Status: DC
  Filled 2021-12-16: qty 100, 25d supply, fill #0

## 2021-12-16 MED ORDER — INSULIN REGULAR HUMAN 100 UNIT/ML IJ SOLN: INTRAMUSCULAR | 11 refills | Status: DC

## 2021-12-16 MED ORDER — INSULIN NPH (HUMAN) (ISOPHANE) 100 UNIT/ML ~~LOC~~ SUSP: SUBCUTANEOUS | 3 refills | Status: DC

## 2021-12-16 NOTE — Addendum Note (Signed)
Addended by: Kathee Delton on: 12/16/2021 12:18 PM ? ? Modules accepted: Orders ? ?

## 2021-12-16 NOTE — Addendum Note (Signed)
Addended byVidal Schwalbe on: 12/16/2021 12:18 PM ? ? Modules accepted: Orders ? ?

## 2021-12-17 LAB — CBC
Hematocrit: 36.9 % (ref 34.0–46.6)
Hemoglobin: 12.5 g/dL (ref 11.1–15.9)
MCH: 28.8 pg (ref 26.6–33.0)
MCHC: 33.9 g/dL (ref 31.5–35.7)
MCV: 85 fL (ref 79–97)
Platelets: 166 10*3/uL (ref 150–450)
RBC: 4.34 x10E6/uL (ref 3.77–5.28)
RDW: 13.4 % (ref 11.7–15.4)
WBC: 8 10*3/uL (ref 3.4–10.8)

## 2021-12-17 LAB — HIV ANTIBODY (ROUTINE TESTING W REFLEX): HIV Screen 4th Generation wRfx: NONREACTIVE

## 2021-12-17 LAB — RPR: RPR Ser Ql: NONREACTIVE

## 2021-12-19 ENCOUNTER — Telehealth: Payer: Self-pay | Admitting: Genetics

## 2021-12-19 NOTE — Telephone Encounter (Signed)
Called Kalicia to discuss Leon's carrier screening results. We originally had the laboratory [facility]) send a third saliva kit to Juliana's home for [place] to complete. Upon further investigation, Leon's second saliva sample actually did make it to the lab on time and the result of the horizon carrier screening is negative. The second saliva sample is under a different name Horton [place]). All questions answered.  ?

## 2021-12-23 ENCOUNTER — Other Ambulatory Visit: Payer: Self-pay

## 2022-01-02 ENCOUNTER — Encounter: Payer: Self-pay | Admitting: Family Medicine

## 2022-01-02 ENCOUNTER — Ambulatory Visit (INDEPENDENT_AMBULATORY_CARE_PROVIDER_SITE_OTHER): Payer: Self-pay | Admitting: Family Medicine

## 2022-01-02 ENCOUNTER — Other Ambulatory Visit: Payer: Self-pay | Admitting: Family Medicine

## 2022-01-02 VITALS — BP 108/65 | HR 88 | Wt 224.5 lb

## 2022-01-02 DIAGNOSIS — O0991 Supervision of high risk pregnancy, unspecified, first trimester: Secondary | ICD-10-CM

## 2022-01-02 DIAGNOSIS — Z789 Other specified health status: Secondary | ICD-10-CM

## 2022-01-02 DIAGNOSIS — O24419 Gestational diabetes mellitus in pregnancy, unspecified control: Secondary | ICD-10-CM

## 2022-01-02 DIAGNOSIS — O24414 Gestational diabetes mellitus in pregnancy, insulin controlled: Secondary | ICD-10-CM

## 2022-01-02 MED ORDER — INSULIN REGULAR HUMAN 100 UNIT/ML IJ SOLN: INTRAMUSCULAR | 11 refills | Status: DC

## 2022-01-02 MED ORDER — INSULIN NPH (HUMAN) (ISOPHANE) 100 UNIT/ML ~~LOC~~ SUSP: SUBCUTANEOUS | 3 refills | Status: DC

## 2022-01-02 NOTE — Progress Notes (Signed)
? ?  PRENATAL VISIT NOTE ? ?Subjective:  ?Kristen Harvey is a 36 y.o. G3P2002 at [redacted]w[redacted]d being seen today for ongoing prenatal care.  She is currently monitored for the following issues for this high-risk pregnancy and has Sickle cell trait ([place]); Gestational diabetes mellitus; Supervision of high risk pregnancy in first trimester; and Language barrier on their problem list. ? ?Patient reports no complaints.  Contractions: Not present. Vag. Bleeding: None.  Movement: Present. Denies leaking of fluid.  ? ?The following portions of the patient's history were reviewed and updated as appropriate: allergies, current medications, past family history, past medical history, past social history, past surgical history and problem list.  ? ?Objective:  ? ?Vitals:  ? 01/02/22 1437  ?BP: 108/65  ?Pulse: 88  ?Weight: 224 lb 8 oz (101.8 kg)  ? ? ?Fetal Status: Fetal Heart Rate (bpm): 140 Fundal Height: 29 cm Movement: Present    ? ?General:  Alert, oriented and cooperative. Patient is in no acute distress.  ?Skin: Skin is warm and dry. No rash noted.   ?Cardiovascular: Normal heart rate noted  ?Respiratory: Normal respiratory effort, no problems with respiration noted  ?Abdomen: Soft, gravid, appropriate for gestational age.  Pain/Pressure: Present     ?Pelvic: Cervical exam deferred        ?Extremities: Normal range of motion.  Edema: Trace  ?Mental Status: Normal mood and affect. Normal behavior. Normal judgment and thought content.  ? ?Assessment and Plan:  ?Pregnancy: [id number] at [redacted]w[redacted]d ?1. Insulin controlled gestational diabetes mellitus (GDM) in third trimester ?- insulin NPH Human (NOVOLIN N) 100 UNIT/ML injection; 35 units in the AM, 22 units in the PM  Dispense: 10 mL; Refill: 3 ?- insulin regular (NOVOLIN R) 100 units/mL injection; 20 units before breakfast and 17 units before dinner  Dispense: 10 mL; Refill: 11 ? ?2. Language barrier ?Spanish interpreter: Gilberto Better used ? ? ?3. Supervision of high risk pregnancy in  first trimester ? ? ? ? ?Preterm labor symptoms and general obstetric precautions including but not limited to vaginal bleeding, contractions, leaking of fluid and fetal movement were reviewed in detail with the patient. ?Please refer to After Visit Summary for other counseling recommendations.  ? ?Return in 2 weeks (on 01/16/2022). ? ?Future Appointments  ?Date Time Provider [place]  ?01/03/2022  3:30 PM [facility] NURSE [facility]  ?01/03/2022  3:45 PM [facility] US6 [facility]  ? ? ?Donnamae Jude, MD ? ?

## 2022-01-03 ENCOUNTER — Ambulatory Visit: Payer: Self-pay | Admitting: *Deleted

## 2022-01-03 ENCOUNTER — Ambulatory Visit: Payer: Self-pay | Attending: Obstetrics and Gynecology

## 2022-01-03 VITALS — BP 105/59 | HR 82

## 2022-01-03 DIAGNOSIS — O403XX0 Polyhydramnios, third trimester, not applicable or unspecified: Secondary | ICD-10-CM | POA: Insufficient documentation

## 2022-01-03 DIAGNOSIS — O24414 Gestational diabetes mellitus in pregnancy, insulin controlled: Secondary | ICD-10-CM | POA: Insufficient documentation

## 2022-01-03 DIAGNOSIS — O09293 Supervision of pregnancy with other poor reproductive or obstetric history, third trimester: Secondary | ICD-10-CM

## 2022-01-03 DIAGNOSIS — E669 Obesity, unspecified: Secondary | ICD-10-CM

## 2022-01-03 DIAGNOSIS — D573 Sickle-cell trait: Secondary | ICD-10-CM | POA: Insufficient documentation

## 2022-01-03 DIAGNOSIS — Z3A29 29 weeks gestation of pregnancy: Secondary | ICD-10-CM

## 2022-01-03 DIAGNOSIS — O99013 Anemia complicating pregnancy, third trimester: Secondary | ICD-10-CM

## 2022-01-03 DIAGNOSIS — O99213 Obesity complicating pregnancy, third trimester: Secondary | ICD-10-CM | POA: Insufficient documentation

## 2022-01-03 DIAGNOSIS — O99019 Anemia complicating pregnancy, unspecified trimester: Secondary | ICD-10-CM | POA: Insufficient documentation

## 2022-01-04 ENCOUNTER — Other Ambulatory Visit: Payer: Self-pay | Admitting: *Deleted

## 2022-01-04 DIAGNOSIS — Z6833 Body mass index (BMI) 33.0-33.9, adult: Secondary | ICD-10-CM

## 2022-01-04 DIAGNOSIS — O24414 Gestational diabetes mellitus in pregnancy, insulin controlled: Secondary | ICD-10-CM

## 2022-01-05 ENCOUNTER — Encounter: Payer: Self-pay | Admitting: Obstetrics and Gynecology

## 2022-01-05 ENCOUNTER — Telehealth: Payer: Self-pay | Admitting: Family Medicine

## 2022-01-05 NOTE — Telephone Encounter (Signed)
Patient was referred to cardiology, the place we sent her told her that financial assistance will not be covering it so patient was not able to get anything done. She would like to know if there is anywhere else we can refer her to.

## 2022-01-05 NOTE — Telephone Encounter (Signed)
Called pt with interpreter Eda Royal. Pt stated that she cancelled her fetal echo appt for tomorrow due to lack of insurance. Pt was advised that [facility] has a financial assistance program for uninsured patients. Contact number provided [phone]. Pt stated that she will contact the financial assistance department and will consider rescheduling the fetal echo appt.

## 2022-01-09 ENCOUNTER — Other Ambulatory Visit: Payer: Self-pay

## 2022-01-19 ENCOUNTER — Encounter: Payer: Self-pay | Admitting: Obstetrics and Gynecology

## 2022-01-19 ENCOUNTER — Ambulatory Visit (INDEPENDENT_AMBULATORY_CARE_PROVIDER_SITE_OTHER): Payer: Self-pay | Admitting: Obstetrics and Gynecology

## 2022-01-19 VITALS — BP 116/75 | HR 79 | Wt 228.3 lb

## 2022-01-19 DIAGNOSIS — Z789 Other specified health status: Secondary | ICD-10-CM

## 2022-01-19 DIAGNOSIS — O0991 Supervision of high risk pregnancy, unspecified, first trimester: Secondary | ICD-10-CM

## 2022-01-19 DIAGNOSIS — O24414 Gestational diabetes mellitus in pregnancy, insulin controlled: Secondary | ICD-10-CM

## 2022-01-19 MED ORDER — INSULIN REGULAR HUMAN 100 UNIT/ML IJ SOLN: INTRAMUSCULAR | 11 refills | Status: DC

## 2022-01-19 MED ORDER — INSULIN NPH (HUMAN) (ISOPHANE) 100 UNIT/ML ~~LOC~~ SUSP: SUBCUTANEOUS | 3 refills | Status: DC

## 2022-01-19 NOTE — Progress Notes (Signed)
Subjective:  Kristen Harvey is a 36 y.o. G3P2002 at [redacted]w[redacted]d being seen today for ongoing prenatal care.  She is currently monitored for the following issues for this high-risk pregnancy and has Sickle cell trait ([place]); Gestational diabetes mellitus; Supervision of high risk pregnancy in first trimester; and Language barrier on their problem list.  Patient reports general discomforts of pregnancy.  Contractions: Not present. Vag. Bleeding: None.  Movement: Present. Denies leaking of fluid.   The following portions of the patient's history were reviewed and updated as appropriate: allergies, current medications, past family history, past medical history, past social history, past surgical history and problem list. Problem list updated.  Objective:   Vitals:   01/19/22 0929  BP: 116/75  Pulse: 79  Weight: 228 lb 4.8 oz (103.6 kg)    Fetal Status: Fetal Heart Rate (bpm): 136   Movement: Present     General:  Alert, oriented and cooperative. Patient is in no acute distress.  Skin: Skin is warm and dry. No rash noted.   Cardiovascular: Normal heart rate noted  Respiratory: Normal respiratory effort, no problems with respiration noted  Abdomen: Soft, gravid, appropriate for gestational age. Pain/Pressure: Present     Pelvic:  Cervical exam deferred        Extremities: Normal range of motion.  Edema: Trace  Mental Status: Normal mood and affect. Normal behavior. Normal judgment and thought content.   Urinalysis:      Assessment and Plan:  Pregnancy: G3P2002 at [redacted]w[redacted]d  1. Supervision of high risk pregnancy in first trimester Stable  2. Insulin controlled gestational diabetes mellitus (GDM) in third trimester Insulin adjusted. New Rx sent to pharmacy Serial growth scans and weekly antenatal testing starting next week [facility]  3. Language barrier Live interrupter used during today's visit  Preterm labor symptoms and general obstetric precautions  including but not limited to vaginal bleeding, contractions, leaking of fluid and fetal movement were reviewed in detail with the patient. Please refer to After Visit Summary for other counseling recommendations.  Return in about 2 weeks (around 02/02/2022) for OB visit, face to face, MD only.   Chancy Milroy, MD

## 2022-01-19 NOTE — Progress Notes (Signed)
Pt has Glucose logs 

## 2022-01-19 NOTE — Patient Instructions (Signed)
Tercer trimestre de embarazo Third Trimester of Pregnancy  El tercer trimestre de embarazo va desde la semana 28 hasta la semana 40. Tambin se dice que va desde el mes 7 hasta el mes 9. En este trimestre, el beb en gestacin (feto) crece muy rpidamente. Hacia el final del noveno mes, el beb en gestacin mide alrededor de 20 pulgadas (45 cm) de largo. Pesa entre 6 y 10 libras (2,70 y 4,50 kg). Cambios en el cuerpo durante el tercer trimestre Su organismo contina atravesando por muchos cambios durante este perodo. Los cambios varan y generalmente vuelven a la normalidad despus del nacimiento del beb. Cambios fsicos Seguir aumentando de peso. Puede ser que aumente entre 25 y 35 libras (11 y 16 kg) hacia el final del embarazo. Si tiene bajo peso, puede aumentar entre 28 y 40 lb (unos 13 a 18 kg). Si tiene sobrepeso, puede aumentar entre 15 y 25 libras (unos 7 a 11 kg). Podrn aparecer las primeras estras en las caderas, el vientre (abdomen) y las mamas. Las mamas seguirn creciendo y pueden doler. Un lquido amarillo (calostro) puede salir de sus pechos. Esta es la primera leche que usted produce para el beb. Tal vez haya cambios en el cabello. El ombligo puede salir hacia afuera. Puede observar que se le hinchan ms las manos, la cara o los tobillos. Cambios en la salud Es posible que tenga acidez estomacal. Es posible que tenga dificultades para defecar (estreimiento). Pueden aparecerle hemorroides. Estas son venas hinchadas en el ano que pueden picar o doler. Puede comenzar a tener venas hinchadas (vrices) en las piernas. Puede presentar ms dolor en la pelvis, la espalda o los muslos. Puede presentar ms hormigueo o entumecimiento en las manos, los brazos y las piernas. La piel de su vientre tambin puede sentirse entumecida. Es posible que sienta falta de aire a medida que el tero se agranda. Otros cambios Es posible que haga pis (orine) con mayor frecuencia. Puede tener ms  problemas para dormir. Puede notar que el beb en gestacin "baja" o se mueve ms hacia bajo, en el vientre. Puede notar ms secrecin proveniente de la vagina. Puede sentir las articulaciones flojas y puede sentir dolor alrededor del hueso plvico. Siga estas instrucciones en su casa: Medicamentos Use los medicamentos de venta libre y los recetados solamente como se lo haya indicado el mdico. Algunos medicamentos no son seguros durante el embarazo. Tome vitaminas prenatales que contengan por lo menos 600 microgramos (mcg) de cido flico. Comida y bebida Consuma comidas saludables que incluyan lo siguiente: Frutas y verduras frescas. Cereales integrales. Buenas fuentes de protenas, como carne, huevos y tofu. Productos lcteos con bajo contenido de grasa. Evite la carne cruda y el jugo, la leche y el queso sin pasteurizar. Estos portan grmenes que pueden provocar dao tanto a usted como al beb. Tome 4 o 5 comidas pequeas en lugar de 3 comidas abundantes al da. Es posible que deba tomar medidas para prevenir o tratar los problemas para defecar: Beber suficiente lquido para mantener el pis (orina) de color amarillo plido. Come alimentos ricos en fibra. Entre ellos, frijoles, cereales integrales y frutas y verduras frescas. Limitar los alimentos con alto contenido de grasa y azcar. Estos incluyen alimentos fritos o dulces. Actividad Haga ejercicios solamente como se lo haya indicado el mdico. Interrumpa la actividad fsica si comienza a tener clicos en el tero. Evite levantar pesos excesivos. No haga ejercicio si hace demasiado calor, hay demasiada humedad o se encuentra en un lugar de mucha   altura (altitud elevada). Si lo desea, puede continuar teniendo relaciones sexuales, a menos que el mdico le indique lo contrario. Alivio del dolor y del malestar Haga pausas con frecuencia y descanse con las piernas levantadas (elevadas) si tiene calambres en las piernas o dolor en la parte  baja de la espalda. Dese baos de asiento con agua tibia para aliviar el dolor o las molestias causadas por las hemorroides. Use una crema para las hemorroides si el mdico la autoriza. Use un sostn que le brinde buen soporte si sus mamas estn sensibles. Si desarrolla venas hinchadas y abultadas en las piernas: Use medias de compresin segn las indicaciones de su mdico. Levante los pies durante 15 minutos, 3 o 4 veces por da. Limite la sal en sus alimentos. Seguridad Hable con el mdico antes de recorrer largas distancias. No se d baos de inmersin en agua caliente, baos turcos ni saunas. Use el cinturn de seguridad en todo momento mientras vaya en auto. Hable con el mdico si alguien le est haciendo dao o gritando mucho. Preparacin para la llegada del beb Para prepararse para la llegada de su beb: Tome clases prenatales. Visite el hospital y recorra el rea de maternidad. Compre un asiento de seguridad orientado hacia atrs para llevar al beb en el automvil. Aprenda cmo instalarlo en el auto. Prepare la habitacin del beb. Saque todas las almohadas y los animales de peluche de la cuna del beb. Instrucciones generales Evite el contacto con las bandejas sanitarias de los gatos y la tierra que estos animales usan. Estos contienen grmenes que pueden daar al beb y causar la prdida del beb ya sea aborto espontneo o muerte fetal. No se haga duchas vaginales ni use tampones. No use tampones ni toallas higinicas perfumadas. No fume ni consuma ningn producto que contenga nicotina o tabaco. Si necesita ayuda para dejar de fumar, consulte al mdico. No beba alcohol. No use medicamentos a base de hierbas, drogas ilegales, ni medicamentos que el mdico no haya autorizado. Las sustancias qumicas de estos productos pueden afectar al beb. Cumpla con todas las visitas de seguimiento. Esto es importante. Dnde buscar ms informacin American Pregnancy Association (Asociacin  Americana del Embarazo): americanpregnancy.org American College of Obstetricians and Gynecologists (Colegio Estadounidense de Obstetras y Gineclogos): www.acog.org Office on Women's Health (Oficina para la Salud de la Mujer): womenshealth.gov/pregnancy Comunquese con un mdico si: Tiene fiebre. Tiene clicos leves o siente presin en la parte baja del vientre. Sufre un dolor persistente en el abdomen. Vomita o hace deposiciones acuosas (diarrea). Advierte lquido con mal olor que proviene de la vagina. Siente dolor al orinar o hace orina con mal olor. Tiene un dolor de cabeza que no desaparece despus de tomar analgsicos. Nota cambios en la visin o ve manchas delante de los ojos. Solicite ayuda de inmediato si: Rompe la bolsa. Tiene contracciones regulares separadas por menos de 5 minutos. Tiene sangrado o pequeas prdidas vaginales. Tiene clicos o dolor muy intensos en el vientre. Tiene dificultad para respirar. Sientes dolor en el pecho. Se desmaya. No ha sentido al beb moverse durante el tiempo que le indic el mdico. Tiene dolor, hinchazn o enrojecimiento nuevos en un brazo o una pierna o se produce un aumento de alguno de estos sntomas. Resumen El tercer trimestre comprende desde la semana 28 hasta la semana 40 (desde el mes 7 hasta el mes 9). Esta es la poca en que el beb en gestacin crece muy rpidamente. Durante este perodo, las molestias pueden aumentar a medida que usted   sube de peso y el beb crece. Preprese para la llegada del beb: asista a las clases prenatales, compre un asiento de seguridad orientado hacia atrs para llevar al beb en auto y prepare la habitacin del beb. Solicite ayuda de inmediato si tiene sangrado por la vagina, siente dolor en el pecho y tiene dificultad para respirar, o si no ha sentido al beb moverse durante el tiempo que le indic el mdico. Esta informacin no tiene como fin reemplazar el consejo del mdico. Asegrese de hacerle al  mdico cualquier pregunta que tenga. Document Revised: 02/18/2020 Document Reviewed: 02/18/2020 Elsevier Patient Education  2023 Elsevier Inc.  

## 2022-01-24 ENCOUNTER — Other Ambulatory Visit: Payer: Self-pay | Admitting: *Deleted

## 2022-01-24 ENCOUNTER — Ambulatory Visit: Payer: Self-pay | Admitting: *Deleted

## 2022-01-24 ENCOUNTER — Ambulatory Visit: Payer: Self-pay | Attending: Obstetrics

## 2022-01-24 VITALS — BP 105/65 | HR 78

## 2022-01-24 DIAGNOSIS — D573 Sickle-cell trait: Secondary | ICD-10-CM

## 2022-01-24 DIAGNOSIS — O99213 Obesity complicating pregnancy, third trimester: Secondary | ICD-10-CM

## 2022-01-24 DIAGNOSIS — O99013 Anemia complicating pregnancy, third trimester: Secondary | ICD-10-CM

## 2022-01-24 DIAGNOSIS — O24414 Gestational diabetes mellitus in pregnancy, insulin controlled: Secondary | ICD-10-CM | POA: Insufficient documentation

## 2022-01-24 DIAGNOSIS — E669 Obesity, unspecified: Secondary | ICD-10-CM

## 2022-01-24 DIAGNOSIS — Z3A32 32 weeks gestation of pregnancy: Secondary | ICD-10-CM

## 2022-01-24 DIAGNOSIS — O09293 Supervision of pregnancy with other poor reproductive or obstetric history, third trimester: Secondary | ICD-10-CM

## 2022-01-24 DIAGNOSIS — Z6833 Body mass index (BMI) 33.0-33.9, adult: Secondary | ICD-10-CM | POA: Insufficient documentation

## 2022-01-31 ENCOUNTER — Ambulatory Visit: Payer: Self-pay

## 2022-01-31 ENCOUNTER — Ambulatory Visit: Payer: Self-pay | Admitting: *Deleted

## 2022-01-31 ENCOUNTER — Ambulatory Visit: Payer: Self-pay | Attending: Obstetrics

## 2022-01-31 ENCOUNTER — Encounter: Payer: Self-pay | Admitting: *Deleted

## 2022-01-31 VITALS — BP 107/68 | HR 73

## 2022-01-31 DIAGNOSIS — Z6833 Body mass index (BMI) 33.0-33.9, adult: Secondary | ICD-10-CM | POA: Insufficient documentation

## 2022-01-31 DIAGNOSIS — Z3A33 33 weeks gestation of pregnancy: Secondary | ICD-10-CM

## 2022-01-31 DIAGNOSIS — Z362 Encounter for other antenatal screening follow-up: Secondary | ICD-10-CM

## 2022-01-31 DIAGNOSIS — O24414 Gestational diabetes mellitus in pregnancy, insulin controlled: Secondary | ICD-10-CM

## 2022-01-31 DIAGNOSIS — O09293 Supervision of pregnancy with other poor reproductive or obstetric history, third trimester: Secondary | ICD-10-CM

## 2022-02-07 ENCOUNTER — Other Ambulatory Visit: Payer: Self-pay

## 2022-02-07 ENCOUNTER — Ambulatory Visit: Payer: Self-pay

## 2022-02-08 ENCOUNTER — Ambulatory Visit: Payer: Self-pay | Admitting: General Practice

## 2022-02-08 ENCOUNTER — Ambulatory Visit (INDEPENDENT_AMBULATORY_CARE_PROVIDER_SITE_OTHER): Payer: Self-pay

## 2022-02-08 ENCOUNTER — Ambulatory Visit (INDEPENDENT_AMBULATORY_CARE_PROVIDER_SITE_OTHER): Payer: Self-pay | Admitting: Family Medicine

## 2022-02-08 ENCOUNTER — Other Ambulatory Visit: Payer: Self-pay

## 2022-02-08 ENCOUNTER — Encounter: Payer: Self-pay | Admitting: Family Medicine

## 2022-02-08 VITALS — BP 113/78 | HR 96 | Wt 233.0 lb

## 2022-02-08 DIAGNOSIS — Z789 Other specified health status: Secondary | ICD-10-CM

## 2022-02-08 DIAGNOSIS — O24414 Gestational diabetes mellitus in pregnancy, insulin controlled: Secondary | ICD-10-CM

## 2022-02-08 DIAGNOSIS — Z3A34 34 weeks gestation of pregnancy: Secondary | ICD-10-CM

## 2022-02-08 DIAGNOSIS — D573 Sickle-cell trait: Secondary | ICD-10-CM

## 2022-02-08 DIAGNOSIS — O0991 Supervision of high risk pregnancy, unspecified, first trimester: Secondary | ICD-10-CM

## 2022-02-08 MED ORDER — INSULIN REGULAR HUMAN 100 UNIT/ML IJ SOLN: INTRAMUSCULAR | 11 refills | Status: DC

## 2022-02-08 MED ORDER — INSULIN NPH (HUMAN) (ISOPHANE) 100 UNIT/ML ~~LOC~~ SUSP: SUBCUTANEOUS | 3 refills | Status: DC

## 2022-02-08 NOTE — Addendum Note (Signed)
Addended by: Kathee Delton on: 02/08/2022 03:50 PM   Modules accepted: Orders

## 2022-02-08 NOTE — Progress Notes (Signed)
   Subjective:  Kristen Harvey is a 36 y.o. G3P2002 at [redacted]w[redacted]d being seen today for ongoing prenatal care.  She is currently monitored for the following issues for this high-risk pregnancy and has Sickle cell trait (HCC); Gestational diabetes mellitus; Supervision of high risk pregnancy in first trimester; and Language barrier on their problem list.  Patient reports no complaints.  Contractions: Not present.  .  Movement: Present. Denies leaking of fluid.   The following portions of the patient's history were reviewed and updated as appropriate: allergies, current medications, past family history, past medical history, past social history, past surgical history and problem list. Problem list updated.  Objective:   Vitals:   02/08/22 1341  BP: 113/78  Pulse: 96  Weight: 233 lb (105.7 kg)    Fetal Status: Fetal Heart Rate (bpm): 134   Movement: Present     General:  Alert, oriented and cooperative. Patient is in no acute distress.  Skin: Skin is warm and dry. No rash noted.   Cardiovascular: Normal heart rate noted  Respiratory: Normal respiratory effort, no problems with respiration noted  Abdomen: Soft, gravid, appropriate for gestational age. Pain/Pressure: Absent     Pelvic:       Cervical exam deferred        Extremities: Normal range of motion.     Mental Status: Normal mood and affect. Normal behavior. Normal judgment and thought content.   Urinalysis:        Assessment and Plan:  Pregnancy: G3P2002 at [redacted]w[redacted]d  1. Supervision of high risk pregnancy in first trimester BP and FHR normal Discussed contraception, planning on hormonal IUD at [facility]  2. Insulin controlled gestational diabetes mellitus (GDM) in third trimester Very poor glucose control, see picture Currently on NPH 35/26 and regular 20/8/21 Will increase to NPH 40/28 and regular 24/10/25 Last growth on 01/03/2022, EFW 55%, AFI 21 Engaged in weekly testing, has BPP later today Next growth [place]  03/01/2022 Fetal echo done 01/26/2022, limited views but no major abnormalities seen though arch sidedness and possible vascular ring remain possibilities, needs postnatal fetal echo Based on current trends plan for delivery at 37 weeks  3. Language barrier Spanish  4. Sickle cell trait (HCC) Partner reportedly tested negative in the past  Preterm labor symptoms and general obstetric precautions including but not limited to vaginal bleeding, contractions, leaking of fluid and fetal movement were reviewed in detail with the patient. Please refer to After Visit Summary for other counseling recommendations.  Return in about 2 weeks (around 02/22/2022) for [facility], ob visit, needs MD.   Venora Maples, MD

## 2022-02-08 NOTE — Patient Instructions (Signed)
De la insulina NPH (la N, nubosa) toma 40 en la [place], y 28 en la tarde  [employer] insulina regular (la R, clara) toma 24 con desayuno, 10 con almuerzo, y 25 con la cena  Vigila mas la dieta diabetica!

## 2022-02-08 NOTE — Progress Notes (Signed)
Pt informed that the ultrasound is considered a limited OB ultrasound and is not intended to be a complete ultrasound exam.  Patient also informed that the ultrasound is not being completed with the intent of assessing for fetal or placental anomalies or any pelvic abnormalities.  Explained that the purpose of today's ultrasound is to assess for  BPP, presentation, and AFI.  Patient acknowledges the purpose of the exam and the limitations of the study.     Alixander Rallis H RN BSN 02/08/22  

## 2022-02-09 ENCOUNTER — Encounter: Payer: Self-pay | Admitting: Family Medicine

## 2022-02-09 DIAGNOSIS — O321XX0 Maternal care for breech presentation, not applicable or unspecified: Secondary | ICD-10-CM | POA: Insufficient documentation

## 2022-02-15 ENCOUNTER — Ambulatory Visit (INDEPENDENT_AMBULATORY_CARE_PROVIDER_SITE_OTHER): Payer: Self-pay

## 2022-02-15 ENCOUNTER — Ambulatory Visit: Payer: Self-pay | Admitting: *Deleted

## 2022-02-15 VITALS — BP 112/80 | HR 89 | Wt 231.9 lb

## 2022-02-15 DIAGNOSIS — Z3A35 35 weeks gestation of pregnancy: Secondary | ICD-10-CM

## 2022-02-15 DIAGNOSIS — O24414 Gestational diabetes mellitus in pregnancy, insulin controlled: Secondary | ICD-10-CM

## 2022-02-15 NOTE — Progress Notes (Signed)
Pt informed that the ultrasound is considered a limited OB ultrasound and is not intended to be a complete ultrasound exam.  Patient also informed that the ultrasound is not being completed with the intent of assessing for fetal or placental anomalies or any pelvic abnormalities.  Explained that the purpose of today's ultrasound is to assess for presentation, BPP and amniotic fluid volume.  Patient acknowledges the purpose of the exam and the limitations of the study.    Pt reports having sharp pain in Lt breast x2-3 weeks which is becoming more frequent and intense.  The pain made it difficult for her to sleep last night.  Dr. Crissie Reese informed.

## 2022-02-22 ENCOUNTER — Other Ambulatory Visit: Payer: Self-pay

## 2022-02-22 ENCOUNTER — Ambulatory Visit (INDEPENDENT_AMBULATORY_CARE_PROVIDER_SITE_OTHER): Payer: Self-pay | Admitting: Family Medicine

## 2022-02-22 ENCOUNTER — Other Ambulatory Visit: Payer: Self-pay | Admitting: Advanced Practice Midwife

## 2022-02-22 ENCOUNTER — Ambulatory Visit (INDEPENDENT_AMBULATORY_CARE_PROVIDER_SITE_OTHER): Payer: Self-pay

## 2022-02-22 ENCOUNTER — Other Ambulatory Visit (HOSPITAL_COMMUNITY)
Admission: RE | Admit: 2022-02-22 | Discharge: 2022-02-22 | Disposition: A | Discharge status: Home / Self Care | Payer: Self-pay | Source: Ambulatory Visit | Attending: Family Medicine | Admitting: Family Medicine

## 2022-02-22 ENCOUNTER — Ambulatory Visit: Payer: Self-pay | Admitting: *Deleted

## 2022-02-22 VITALS — BP 110/77 | HR 87 | Wt 232.3 lb

## 2022-02-22 DIAGNOSIS — Z3A36 36 weeks gestation of pregnancy: Secondary | ICD-10-CM

## 2022-02-22 DIAGNOSIS — O0991 Supervision of high risk pregnancy, unspecified, first trimester: Secondary | ICD-10-CM | POA: Insufficient documentation

## 2022-02-22 DIAGNOSIS — O321XX0 Maternal care for breech presentation, not applicable or unspecified: Secondary | ICD-10-CM

## 2022-02-22 DIAGNOSIS — O24414 Gestational diabetes mellitus in pregnancy, insulin controlled: Secondary | ICD-10-CM

## 2022-02-22 DIAGNOSIS — Z789 Other specified health status: Secondary | ICD-10-CM

## 2022-02-22 MED ORDER — INSULIN NPH (HUMAN) (ISOPHANE) 100 UNIT/ML ~~LOC~~ SUSP: SUBCUTANEOUS | 3 refills | Status: DC

## 2022-02-22 NOTE — Progress Notes (Signed)
BPP - 10/10, Frank breech.  Pt reports increased abdominal/pelvic pain x3 days - like period cramps.

## 2022-02-22 NOTE — Progress Notes (Signed)
   PRENATAL VISIT NOTE  Subjective:  Kristen Harvey is a 36 y.o. G3P2002 at [redacted]w[redacted]d being seen today for ongoing prenatal care.  She is currently monitored for the following issues for this high-risk pregnancy and has Sickle cell trait (HCC); Gestational diabetes mellitus; Supervision of high risk pregnancy in first trimester; Language barrier; and Breech presentation on their problem list.  Patient reports occasional contractions.  Contractions: Irregular. Vag. Bleeding: None.  Movement: Present. Denies leaking of fluid.   The following portions of the patient's history were reviewed and updated as appropriate: allergies, current medications, past family history, past medical history, past social history, past surgical history and problem list.   Objective:   Vitals:   02/22/22 1424  BP: 110/77  Pulse: 87  Weight: 232 lb 4.8 oz (105.4 kg)    Fetal Status: Fetal Heart Rate (bpm): RNST Fundal Height: 38 cm Movement: Present  Presentation: Homero Fellers Breech  General:  Alert, oriented and cooperative. Patient is in no acute distress.  Skin: Skin is warm and dry. No rash noted.   Cardiovascular: Normal heart rate noted  Respiratory: Normal respiratory effort, no problems with respiration noted  Abdomen: Soft, gravid, appropriate for gestational age.  Pain/Pressure: Present     Pelvic: Cervical exam performed in the presence of a chaperone Dilation: Fingertip Effacement (%): Thick Station: -3  Extremities: Normal range of motion.     Mental Status: Normal mood and affect. Normal behavior. Normal judgment and thought content.   Assessment and Plan:  Pregnancy: G3P2002 at [redacted]w[redacted]d 1. Supervision of high risk pregnancy in first trimester FHT normal - Culture, beta strep (group b only) - Cervicovaginal ancillary only( [place])  2. Insulin controlled gestational diabetes mellitus (GDM) in third trimester BPP 10/10 All cbgs elevated - fastings 110, PP 160. Increase NPH to 45 in AM  and 35 in pm. Schedule for delivery at 37 weeks  - insulin NPH Human (NOVOLIN N) 100 UNIT/ML injection; 45 units with breakfast  35 units with dinner  Dispense: 10 mL; Refill: 3  3. Language barrier Interpreter used  4. Breech presentation, single or unspecified fetus Discussed options - ECV vs PLTCS vs Breech vaginal delivery. Patient desires ECV. Will schedule with patient staying for delivery.  Term labor symptoms and general obstetric precautions including but not limited to vaginal bleeding, contractions, leaking of fluid and fetal movement were reviewed in detail with the patient. Please refer to After Visit Summary for other counseling recommendations.   Return in about 1 week (around 03/01/2022) for [facility] as scheduled.  Future Appointments  Date Time Provider Department Center  03/01/2022  1:35 PM [place], MD [facility]  03/01/2022  2:30 PM [facility] NURSE [facility]  03/01/2022  2:45 PM [facility] US5 [facility]  03/09/2022  1:55 PM Warden Fillers, MD [facility]  03/09/2022  3:15 PM [facility] NST [facility]  03/16/2022  1:35 PM Warden Fillers, MD [facility]    Levie Heritage, DO

## 2022-02-23 ENCOUNTER — Other Ambulatory Visit (HOSPITAL_COMMUNITY): Payer: Self-pay

## 2022-02-23 LAB — CERVICOVAGINAL ANCILLARY ONLY
Chlamydia: NEGATIVE
Comment: NEGATIVE
Comment: NORMAL
Neisseria Gonorrhea: NEGATIVE

## 2022-02-24 ENCOUNTER — Inpatient Hospital Stay (HOSPITAL_COMMUNITY): Payer: 59 | Admitting: Anesthesiology

## 2022-02-24 ENCOUNTER — Inpatient Hospital Stay (HOSPITAL_COMMUNITY): Payer: 59 | Admitting: Certified Registered Nurse Anesthetist

## 2022-02-24 ENCOUNTER — Encounter (HOSPITAL_COMMUNITY)
Admission: AD | Disposition: A | Discharge status: Home / Self Care | Payer: Self-pay | Source: Home / Self Care | Attending: Obstetrics & Gynecology

## 2022-02-24 ENCOUNTER — Observation Stay (HOSPITAL_COMMUNITY): Payer: Self-pay | Attending: Obstetrics & Gynecology

## 2022-02-24 ENCOUNTER — Other Ambulatory Visit: Payer: Self-pay

## 2022-02-24 ENCOUNTER — Observation Stay (HOSPITAL_COMMUNITY): Admission: AD | Admit: 2022-02-24 | Payer: Self-pay | Source: Home / Self Care | Admitting: Obstetrics & Gynecology

## 2022-02-24 ENCOUNTER — Encounter (HOSPITAL_COMMUNITY): Payer: Self-pay | Admitting: Obstetrics & Gynecology

## 2022-02-24 ENCOUNTER — Inpatient Hospital Stay (HOSPITAL_COMMUNITY)
Admission: AD | Admit: 2022-02-24 | Discharge: 2022-02-27 | DRG: 785 | Disposition: A | Discharge status: Home / Self Care | Payer: 59 | Attending: Obstetrics & Gynecology | Admitting: Obstetrics & Gynecology

## 2022-02-24 DIAGNOSIS — O24420 Gestational diabetes mellitus in childbirth, diet controlled: Secondary | ICD-10-CM

## 2022-02-24 DIAGNOSIS — O329XX0 Maternal care for malpresentation of fetus, unspecified, not applicable or unspecified: Secondary | ICD-10-CM | POA: Diagnosis not present

## 2022-02-24 DIAGNOSIS — Z302 Encounter for sterilization: Secondary | ICD-10-CM

## 2022-02-24 DIAGNOSIS — O24424 Gestational diabetes mellitus in childbirth, insulin controlled: Secondary | ICD-10-CM | POA: Diagnosis present

## 2022-02-24 DIAGNOSIS — D573 Sickle-cell trait: Secondary | ICD-10-CM | POA: Diagnosis present

## 2022-02-24 DIAGNOSIS — O99214 Obesity complicating childbirth: Secondary | ICD-10-CM | POA: Diagnosis present

## 2022-02-24 DIAGNOSIS — Z3A37 37 weeks gestation of pregnancy: Secondary | ICD-10-CM | POA: Diagnosis not present

## 2022-02-24 DIAGNOSIS — Z3490 Encounter for supervision of normal pregnancy, unspecified, unspecified trimester: Principal | ICD-10-CM

## 2022-02-24 DIAGNOSIS — Z7982 Long term (current) use of aspirin: Secondary | ICD-10-CM | POA: Diagnosis not present

## 2022-02-24 DIAGNOSIS — O9902 Anemia complicating childbirth: Secondary | ICD-10-CM | POA: Diagnosis present

## 2022-02-24 DIAGNOSIS — O24419 Gestational diabetes mellitus in pregnancy, unspecified control: Secondary | ICD-10-CM | POA: Diagnosis present

## 2022-02-24 DIAGNOSIS — Z789 Other specified health status: Secondary | ICD-10-CM | POA: Diagnosis present

## 2022-02-24 DIAGNOSIS — O321XX0 Maternal care for breech presentation, not applicable or unspecified: Secondary | ICD-10-CM | POA: Diagnosis present

## 2022-02-24 LAB — COMPREHENSIVE METABOLIC PANEL
ALT: 15 U/L (ref 0–44)
AST: 20 U/L (ref 15–41)
Albumin: 2.8 g/dL — ABNORMAL LOW (ref 3.5–5.0)
Alkaline Phosphatase: 151 U/L — ABNORMAL HIGH (ref 38–126)
Anion gap: 11 (ref 5–15)
BUN: <5 mg/dL — ABNORMAL LOW (ref 6–20)
CO2: 20 mmol/L — ABNORMAL LOW (ref 22–32)
Calcium: 8.8 mg/dL — ABNORMAL LOW (ref 8.9–10.3)
Chloride: 105 mmol/L (ref 98–111)
Creatinine, Ser: 0.64 mg/dL (ref 0.44–1.00)
GFR, Estimated: >60 mL/min (ref >60)
Glucose, Bld: 63 mg/dL — ABNORMAL LOW (ref 70–99)
Potassium: 3.4 mmol/L — ABNORMAL LOW (ref 3.5–5.1)
Sodium: 136 mmol/L (ref 135–145)
Total Bilirubin: 0.5 mg/dL (ref 0.3–1.2)
Total Protein: 7.3 g/dL (ref 6.5–8.1)

## 2022-02-24 LAB — CBC
HCT: 40.8 % (ref 36.0–46.0)
Hemoglobin: 13.9 g/dL (ref 12.0–15.0)
MCH: 27.7 pg (ref 26.0–34.0)
MCHC: 34.1 g/dL (ref 30.0–36.0)
MCV: 81.4 fL (ref 80.0–100.0)
Platelets: 172 10*3/uL (ref 150–400)
RBC: 5.01 MIL/uL (ref 3.87–5.11)
RDW: 14.1 % (ref 11.5–15.5)
WBC: 9.8 10*3/uL (ref 4.0–10.5)
nRBC: 0 % (ref 0.0–0.2)

## 2022-02-24 LAB — RPR: RPR Ser Ql: NONREACTIVE

## 2022-02-24 LAB — TYPE AND SCREEN
ABO/RH(D): O POS
Antibody Screen: NEGATIVE

## 2022-02-24 LAB — GLUCOSE, CAPILLARY: Glucose-Capillary: 74 mg/dL (ref 70–99)

## 2022-02-24 SURGERY — Surgical Case
Anesthesia: Epidural | Site: Abdomen | Wound class: Clean Contaminated

## 2022-02-24 MED ORDER — TRANEXAMIC ACID-NACL 1000-0.7 MG/100ML-% IV SOLN
1000.0000 mg | INTRAVENOUS | Status: AC
  Administered 2022-02-24: 1000 mg via INTRAVENOUS

## 2022-02-24 MED ORDER — DEXMEDETOMIDINE (PRECEDEX) IN NS 20 MCG/5ML (4 MCG/ML) IV SYRINGE
PREFILLED_SYRINGE | INTRAVENOUS | Status: DC | PRN
  Administered 2022-02-24: 8 ug via INTRAVENOUS
  Administered 2022-02-24: 12 ug via INTRAVENOUS
  Administered 2022-02-24: 8 ug via INTRAVENOUS

## 2022-02-24 MED ORDER — OXYCODONE HCL 5 MG PO TABS
5.0000 mg | ORAL_TABLET | ORAL | Status: DC | PRN
  Administered 2022-02-25 – 2022-02-27 (×4): 10 mg via ORAL
  Filled 2022-02-24 (×4): qty 2

## 2022-02-24 MED ORDER — FENTANYL CITRATE (PF) 100 MCG/2ML IJ SOLN
INTRAMUSCULAR | Status: AC
  Filled 2022-02-24: qty 2

## 2022-02-24 MED ORDER — ENOXAPARIN SODIUM 60 MG/0.6ML IJ SOSY
50.0000 mg | PREFILLED_SYRINGE | INTRAMUSCULAR | Status: DC
  Administered 2022-02-25 – 2022-02-26 (×2): 50 mg via SUBCUTANEOUS
  Filled 2022-02-24 (×2): qty 0.6

## 2022-02-24 MED ORDER — SENNOSIDES-DOCUSATE SODIUM 8.6-50 MG PO TABS
2.0000 | ORAL_TABLET | Freq: Every day | ORAL | Status: DC
  Administered 2022-02-25 – 2022-02-27 (×3): 2 via ORAL
  Filled 2022-02-24 (×3): qty 2

## 2022-02-24 MED ORDER — SIMETHICONE 80 MG PO CHEW: 80.0000 mg | CHEWABLE_TABLET | ORAL | Status: DC | PRN

## 2022-02-24 MED ORDER — OXYTOCIN-SODIUM CHLORIDE 30-0.9 UT/500ML-% IV SOLN
INTRAVENOUS | Status: DC | PRN
  Administered 2022-02-24: 30 [IU] via INTRAVENOUS

## 2022-02-24 MED ORDER — PRENATAL MULTIVITAMIN CH
1.0000 | ORAL_TABLET | Freq: Every day | ORAL | Status: DC
  Administered 2022-02-25 – 2022-02-27 (×3): 1 via ORAL
  Filled 2022-02-24 (×3): qty 1

## 2022-02-24 MED ORDER — EPHEDRINE 5 MG/ML INJ
INTRAVENOUS | Status: AC
  Filled 2022-02-24: qty 5

## 2022-02-24 MED ORDER — MORPHINE SULFATE (PF) 0.5 MG/ML IJ SOLN
INTRAMUSCULAR | Status: DC | PRN
  Administered 2022-02-24: 3 mg via EPIDURAL

## 2022-02-24 MED ORDER — DIBUCAINE (PERIANAL) 1 % EX OINT
1.0000 | TOPICAL_OINTMENT | CUTANEOUS | Status: DC | PRN
Start: 2022-02-24 — End: 2022-02-27

## 2022-02-24 MED ORDER — ACETAMINOPHEN 10 MG/ML IV SOLN
INTRAVENOUS | Status: AC
  Filled 2022-02-24: qty 100

## 2022-02-24 MED ORDER — KETOROLAC TROMETHAMINE 30 MG/ML IJ SOLN
30.0000 mg | Freq: Four times a day (QID) | INTRAMUSCULAR | Status: AC
  Administered 2022-02-24 – 2022-02-25 (×3): 30 mg via INTRAVENOUS
  Filled 2022-02-24 (×3): qty 1

## 2022-02-24 MED ORDER — FENTANYL-BUPIVACAINE-NACL 0.5-0.125-0.9 MG/250ML-% EP SOLN: 12.0000 mL/h | EPIDURAL | Status: DC | PRN

## 2022-02-24 MED ORDER — LACTATED RINGERS IV SOLN: INTRAVENOUS | Status: DC

## 2022-02-24 MED ORDER — TETANUS-DIPHTH-ACELL PERTUSSIS 5-2.5-18.5 LF-MCG/0.5 IM SUSY: 0.5000 mL | PREFILLED_SYRINGE | Freq: Once | INTRAMUSCULAR | Status: DC

## 2022-02-24 MED ORDER — PHENYLEPHRINE 80 MCG/ML (10ML) SYRINGE FOR IV PUSH (FOR BLOOD PRESSURE SUPPORT)
PREFILLED_SYRINGE | INTRAVENOUS | Status: AC
  Filled 2022-02-24: qty 10

## 2022-02-24 MED ORDER — CHLOROPROCAINE HCL (PF) 3 % IJ SOLN
INTRAMUSCULAR | Status: AC
  Filled 2022-02-24: qty 20

## 2022-02-24 MED ORDER — SIMETHICONE 80 MG PO CHEW
80.0000 mg | CHEWABLE_TABLET | Freq: Three times a day (TID) | ORAL | Status: DC
  Administered 2022-02-25 – 2022-02-27 (×5): 80 mg via ORAL
  Filled 2022-02-24 (×6): qty 1

## 2022-02-24 MED ORDER — MENTHOL 3 MG MT LOZG: 1.0000 | LOZENGE | OROMUCOSAL | Status: DC | PRN

## 2022-02-24 MED ORDER — ACETAMINOPHEN 10 MG/ML IV SOLN
INTRAVENOUS | Status: DC | PRN
  Administered 2022-02-24: 1000 mg via INTRAVENOUS

## 2022-02-24 MED ORDER — PHENYLEPHRINE HCL-NACL 20-0.9 MG/250ML-% IV SOLN: INTRAVENOUS | Status: DC | PRN

## 2022-02-24 MED ORDER — ACETAMINOPHEN 325 MG PO TABS
650.0000 mg | ORAL_TABLET | ORAL | Status: DC | PRN
  Administered 2022-02-25 – 2022-02-27 (×3): 650 mg via ORAL
  Filled 2022-02-24 (×3): qty 2

## 2022-02-24 MED ORDER — LACTATED RINGERS IV SOLN
500.0000 mL | Freq: Once | INTRAVENOUS | Status: AC
  Administered 2022-02-24 (×3): 500 mL via INTRAVENOUS

## 2022-02-24 MED ORDER — FENTANYL CITRATE (PF) 100 MCG/2ML IJ SOLN
INTRAMUSCULAR | Status: DC | PRN
Start: 2022-02-24 — End: 2022-02-24
  Administered 2022-02-24: 100 ug via EPIDURAL

## 2022-02-24 MED ORDER — OXYTOCIN-SODIUM CHLORIDE 30-0.9 UT/500ML-% IV SOLN
INTRAVENOUS | Status: AC
  Filled 2022-02-24: qty 500

## 2022-02-24 MED ORDER — WITCH HAZEL-GLYCERIN EX PADS: 1.0000 | MEDICATED_PAD | CUTANEOUS | Status: DC | PRN

## 2022-02-24 MED ORDER — DIPHENHYDRAMINE HCL 50 MG/ML IJ SOLN: 12.5000 mg | INTRAMUSCULAR | Status: DC | PRN

## 2022-02-24 MED ORDER — IBUPROFEN 600 MG PO TABS
600.0000 mg | ORAL_TABLET | Freq: Four times a day (QID) | ORAL | Status: DC
  Administered 2022-02-25 – 2022-02-27 (×8): 600 mg via ORAL
  Filled 2022-02-24 (×7): qty 1

## 2022-02-24 MED ORDER — OXYTOCIN-SODIUM CHLORIDE 30-0.9 UT/500ML-% IV SOLN: 2.5000 [IU]/h | INTRAVENOUS | Status: AC

## 2022-02-24 MED ORDER — MEDROXYPROGESTERONE ACETATE 150 MG/ML IM SUSP
150.0000 mg | INTRAMUSCULAR | Status: DC | PRN
Start: 2022-02-24 — End: 2022-02-27

## 2022-02-24 MED ORDER — LIDOCAINE-EPINEPHRINE (PF) 2 %-1:200000 IJ SOLN
INTRAMUSCULAR | Status: DC | PRN
  Administered 2022-02-24: 10 mL via EPIDURAL

## 2022-02-24 MED ORDER — DIPHENHYDRAMINE HCL 25 MG PO CAPS: 25.0000 mg | ORAL_CAPSULE | Freq: Four times a day (QID) | ORAL | Status: DC | PRN

## 2022-02-24 MED ORDER — TERBUTALINE SULFATE 1 MG/ML IJ SOLN
INTRAMUSCULAR | Status: AC
  Filled 2022-02-24: qty 1

## 2022-02-24 MED ORDER — FENTANYL CITRATE (PF) 100 MCG/2ML IJ SOLN
INTRAMUSCULAR | Status: DC | PRN
  Administered 2022-02-24: 100 ug via INTRAVENOUS

## 2022-02-24 MED ORDER — COCONUT OIL OIL: 1.0000 | TOPICAL_OIL | Status: DC | PRN

## 2022-02-24 MED ORDER — CEFAZOLIN SODIUM-DEXTROSE 2-4 GM/100ML-% IV SOLN
INTRAVENOUS | Status: AC
  Filled 2022-02-24: qty 100

## 2022-02-24 MED ORDER — DEXAMETHASONE SODIUM PHOSPHATE 10 MG/ML IJ SOLN
INTRAMUSCULAR | Status: DC | PRN
  Administered 2022-02-24: 4 mg via INTRAVENOUS

## 2022-02-24 MED ORDER — PHENYLEPHRINE 80 MCG/ML (10ML) SYRINGE FOR IV PUSH (FOR BLOOD PRESSURE SUPPORT)
PREFILLED_SYRINGE | INTRAVENOUS | Status: DC | PRN
  Administered 2022-02-24: 80 ug via INTRAVENOUS

## 2022-02-24 MED ORDER — TRANEXAMIC ACID-NACL 1000-0.7 MG/100ML-% IV SOLN
INTRAVENOUS | Status: AC
  Filled 2022-02-24: qty 100

## 2022-02-24 MED ORDER — ACETAMINOPHEN 10 MG/ML IV SOLN: 1000.0000 mg | Freq: Once | INTRAVENOUS | Status: DC | PRN

## 2022-02-24 MED ORDER — LIDOCAINE-EPINEPHRINE (PF) 2 %-1:200000 IJ SOLN
INTRAMUSCULAR | Status: DC | PRN
  Administered 2022-02-24: 2 mL via EPIDURAL
  Administered 2022-02-24: 3 mL via EPIDURAL
  Administered 2022-02-24: 5 mL via EPIDURAL
  Administered 2022-02-24: 3 mL via EPIDURAL

## 2022-02-24 MED ORDER — MORPHINE SULFATE (PF) 0.5 MG/ML IJ SOLN
INTRAMUSCULAR | Status: AC
  Filled 2022-02-24: qty 10

## 2022-02-24 MED ORDER — CEFAZOLIN SODIUM-DEXTROSE 2-3 GM-%(50ML) IV SOLR
INTRAVENOUS | Status: DC | PRN
  Administered 2022-02-24: 2 g via INTRAVENOUS

## 2022-02-24 MED ORDER — TERBUTALINE SULFATE 1 MG/ML IJ SOLN
0.2500 mg | Freq: Once | INTRAMUSCULAR | Status: AC
Start: 2022-02-24 — End: 2022-02-24
  Administered 2022-02-24: 0.25 mg via SUBCUTANEOUS

## 2022-02-24 MED ORDER — PHENYLEPHRINE 80 MCG/ML (10ML) SYRINGE FOR IV PUSH (FOR BLOOD PRESSURE SUPPORT)
80.0000 ug | PREFILLED_SYRINGE | INTRAVENOUS | Status: AC | PRN
  Administered 2022-02-24 (×3): 80 ug via INTRAVENOUS

## 2022-02-24 MED ORDER — SODIUM CHLORIDE 0.9 % IR SOLN
Status: DC | PRN
  Administered 2022-02-24: 1000 mL

## 2022-02-24 MED ORDER — EPHEDRINE 5 MG/ML INJ: 10.0000 mg | INTRAVENOUS | Status: DC | PRN

## 2022-02-24 MED ORDER — MEASLES, MUMPS & RUBELLA VAC IJ SOLR: 0.5000 mL | Freq: Once | INTRAMUSCULAR | Status: DC

## 2022-02-24 MED ORDER — DEXAMETHASONE SODIUM PHOSPHATE 4 MG/ML IJ SOLN
INTRAMUSCULAR | Status: AC
  Filled 2022-02-24: qty 1

## 2022-02-24 MED ORDER — FENTANYL CITRATE (PF) 100 MCG/2ML IJ SOLN: 25.0000 ug | INTRAMUSCULAR | Status: DC | PRN

## 2022-02-24 MED ORDER — PHENYLEPHRINE 80 MCG/ML (10ML) SYRINGE FOR IV PUSH (FOR BLOOD PRESSURE SUPPORT): 80.0000 ug | PREFILLED_SYRINGE | INTRAVENOUS | Status: DC | PRN

## 2022-02-24 MED ORDER — CEFAZOLIN SODIUM-DEXTROSE 2-4 GM/100ML-% IV SOLN
2.0000 g | Freq: Once | INTRAVENOUS | Status: DC
Start: 2022-02-24 — End: 2022-02-24

## 2022-02-24 MED ORDER — ONDANSETRON HCL 4 MG/2ML IJ SOLN
INTRAMUSCULAR | Status: DC | PRN
  Administered 2022-02-24: 4 mg via INTRAVENOUS

## 2022-02-24 SURGICAL SUPPLY — 35 items
BENZOIN TINCTURE PRP APPL 2/3 (GAUZE/BANDAGES/DRESSINGS) ×3 IMPLANT
CHLORAPREP W/TINT 26ML (MISCELLANEOUS) ×6 IMPLANT
CLAMP CORD UMBIL (MISCELLANEOUS) ×3 IMPLANT
CLOTH BEACON ORANGE TIMEOUT ST (SAFETY) ×3 IMPLANT
DERMABOND ADVANCED (GAUZE/BANDAGES/DRESSINGS)
DERMABOND ADVANCED .7 DNX12 (GAUZE/BANDAGES/DRESSINGS) IMPLANT
DRSG OPSITE POSTOP 4X10 (GAUZE/BANDAGES/DRESSINGS) ×3 IMPLANT
ELECT REM PT RETURN 9FT ADLT (ELECTROSURGICAL) ×3
ELECTRODE REM PT RTRN 9FT ADLT (ELECTROSURGICAL) ×2 IMPLANT
EXTRACTOR VACUUM KIWI (MISCELLANEOUS) IMPLANT
GLOVE BIOGEL PI IND STRL 7.0 (GLOVE) ×6 IMPLANT
GLOVE BIOGEL PI INDICATOR 7.0 (GLOVE) ×3
GLOVE ECLIPSE 6.5 STRL STRAW (GLOVE) ×5 IMPLANT
GOWN STRL REUS W/TWL LRG LVL3 (GOWN DISPOSABLE) ×9 IMPLANT
KIT ABG SYR 3ML LUER SLIP (SYRINGE) IMPLANT
NDL HYPO 25X5/8 SAFETYGLIDE (NEEDLE) IMPLANT
NEEDLE HYPO 25X5/8 SAFETYGLIDE (NEEDLE) IMPLANT
NS IRRIG 1000ML POUR BTL (IV SOLUTION) ×3 IMPLANT
PACK C SECTION WH (CUSTOM PROCEDURE TRAY) ×3 IMPLANT
PAD ABD 7.5X8 STRL (GAUZE/BANDAGES/DRESSINGS) ×3 IMPLANT
PAD OB MATERNITY 4.3X12.25 (PERSONAL CARE ITEMS) ×3 IMPLANT
RTRCTR C-SECT PINK 25CM LRG (MISCELLANEOUS) ×5 IMPLANT
STRIP CLOSURE SKIN 1/2X4 (GAUZE/BANDAGES/DRESSINGS) ×3 IMPLANT
SUT PLAIN 0 NONE (SUTURE) IMPLANT
SUT PLAIN 2 0 XLH (SUTURE) IMPLANT
SUT VIC AB 0 CT1 27 (SUTURE) ×6
SUT VIC AB 0 CT1 27XBRD ANBCTR (SUTURE) ×4 IMPLANT
SUT VIC AB 0 CTX 36 (SUTURE) ×9
SUT VIC AB 0 CTX36XBRD ANBCTRL (SUTURE) ×6 IMPLANT
SUT VIC AB 2-0 CT1 27 (SUTURE) ×3
SUT VIC AB 2-0 CT1 TAPERPNT 27 (SUTURE) ×2 IMPLANT
SUT VIC AB 4-0 KS 27 (SUTURE) ×3 IMPLANT
TOWEL OR 17X24 6PK STRL BLUE (TOWEL DISPOSABLE) ×3 IMPLANT
TRAY FOLEY W/BAG SLVR 14FR LF (SET/KITS/TRAYS/PACK) IMPLANT
WATER STERILE IRR 1000ML POUR (IV SOLUTION) ×3 IMPLANT

## 2022-02-24 NOTE — Transfer of Care (Signed)
Immediate Anesthesia Transfer of Care Note  Patient: Kristen Harvey [name]  Procedure(s) Performed: CESAREAN SECTION WITH BILATERAL TUBAL LIGATION (Abdomen)  Patient Location: PACU  Anesthesia Type:Regional  Level of Consciousness: awake  Airway & Oxygen Therapy: Patient Spontanous Breathing  Post-op Assessment: Report given to RN and Post -op Vital signs reviewed and stable  Post vital signs: Reviewed and stable  Last Vitals:  Vitals Value Taken Time  BP 91/63 02/24/22 1745  Temp 36.5 C 02/24/22 1739  Pulse 80 02/24/22 1750  Resp 12 02/24/22 1750  SpO2 97 % 02/24/22 1750  Vitals shown include unvalidated device data.  Last Pain:  Vitals:   02/24/22 1300  TempSrc: Oral  PainSc:          Complications: No notable events documented.

## 2022-02-24 NOTE — Consult Note (Signed)
Neonatology Note:   Attendance at C-section:    I was asked by Dr. Charlotta Newton to attend this C/S at 37 0/[redacted] weeks EGA for breech presentation after failed version. The mother is a 36 yo with good prenatal care complicated by likely T2DM on insulin, obesity.  GBS pending.  Fetal echo done 01/26/2022, limited views but no major abnormalities seen though arch sidedness and possible vascular ring remain possibilities, needs postnatal fetal echo.  Genetic Screening: Abnormal:  Results: Elevated risk of Trisomy 21.   ROM 0h 98m prior to delivery, fluid clear. Infant vigorous with good spontaneous cry and tone. +60 sec DCC  done. Needed minimal bulb suctioning.   Lungs clear to ausc, good tone but remained cyanotic.  Nl spine.  Not syndromic.  Sao2 checked and in mid 70s.  BBO2 given for a couple minutes; great response. Apgars 8 at 1 minute, 8 at 5 minutes.  Family updated.  To MBU in care of Pediatrician.    Dineen Kid Leary Roca, MD

## 2022-02-24 NOTE — Progress Notes (Signed)
POC for version discussed through interpreter.  POC to attempt version and if unsuccessful, move forward with primary cesarean section.  Patient declines laboring while breech plan of care.  Anesthesia at bedside to obtain consent for epidural procedure. Awaiting CBC result and FOB to return to room prior to epidural procedure.

## 2022-02-24 NOTE — H&P (Signed)
OBSTETRIC ADMISSION HISTORY AND PHYSICAL  Kristen Harvey is a 36 y.o. female G3P2002 with IUP at [redacted]w[redacted]d by LMP presenting for scheduled ECV. She reports +FMs, No LOF, no VB, no blurry vision, headaches or peripheral edema, and RUQ pain.  She plans on breast feeding. She received her prenatal care at [facility]   Spanish interpreter present for entire encounter  Dating: By LMP --->  Estimated Date of Delivery: 03/17/22  Sono:   Last [place]- 2505g, 5#8oz-78%- on 6/13- Breech posterior   Prenatal History/Complications:   -Uncontrolled GDM, likely T2DM currently on NPH and regular-  On 7/5 increased NPH to 45 in AM and 35 in pm regular 24/10/25  -Breech presentation  Past Medical History: Past Medical History:  Diagnosis Date   Diabetes mellitus without complication (HCC)    Fatty liver    Gastritis     Past Surgical History: Past Surgical History:  Procedure Laterality Date   NO PAST SURGERIES      Obstetrical History: OB History     Gravida  3   Para  2   Term  2   Preterm      AB      Living  2      SAB      IAB      Ectopic      Multiple      Live Births  2          Prior- 75 and 78g  Social History Social History   Socioeconomic History   Marital status: Single    Spouse name: Not on file   Number of children: Not on file   Years of education: Not on file   Highest education level: Not on file  Occupational History   Not on file  Tobacco Use   Smoking status: Never   Smokeless tobacco: Never  Vaping Use   Vaping Use: Never used  Substance and Sexual Activity   Alcohol use: Not Currently   Drug use: Never   Sexual activity: Yes  Other Topics Concern   Not on file  Social History Narrative   Not on file   Social Determinants of Health   Financial Resource Strain: Not on file  Food Insecurity: Food Insecurity Present (02/22/2022)   Hunger Vital Sign    Worried About Running Out of Food in the Last Year: Sometimes true     Ran Out of Food in the Last Year: Never true  Transportation Needs: No Transportation Needs (02/22/2022)   PRAPARE - Administrator, Civil Service (Medical): No    Lack of Transportation (Non-Medical): No  Physical Activity: Not on file  Stress: Not on file  Social Connections: Not on file    Family History: Family History  Problem Relation Age of Onset   Hypertension Mother    Diabetes Mother    Hypertension Father    Diabetes Father    Hypertension Maternal Aunt    Diabetes Maternal Aunt    Hypertension Maternal Uncle    Diabetes Maternal Uncle    Hypertension Paternal Aunt    Diabetes Paternal Aunt    Hypertension Paternal Uncle    Diabetes Paternal Uncle    Hypertension Maternal Grandmother    Diabetes Maternal Grandmother    Hypertension Maternal Grandfather    Diabetes Maternal Grandfather    Hypertension Paternal Grandmother    Diabetes Paternal Grandmother    Hypertension Paternal Grandfather    Diabetes Paternal Actor  Allergies: No Known Allergies  Medications Prior to Admission  Medication Sig Dispense Refill Last Dose   aspirin EC 81 MG tablet Take 1 tablet (81 mg total) by mouth daily. Swallow whole. 30 tablet 11    insulin NPH Human (NOVOLIN N) 100 UNIT/ML injection 45 units with breakfast  35 units with dinner 10 mL 3    insulin regular (NOVOLIN R) 100 units/mL injection 24 units with breakfast,   10 units with lunch,   25 units with dinner (Patient not taking: Reported on 02/22/2022) 10 mL 11    Insulin Syringe-Needle U-100 (CARETOUCH INSULIN SYRINGE) 31G X 5/16" 0.3 ML MISC Use as directed to inject 10 Units in the morning and at bedtime. 100 each 4    loratadine (CLARITIN) 10 MG tablet Take 1 tablet (10 mg total) by mouth daily. (Patient not taking: Reported on 02/22/2022) 30 tablet 1    Prenatal Vit-Fe Fumarate-FA (MULTIVITAMIN-PRENATAL) 27-0.8 MG TABS tablet Take 1 tablet by mouth daily at 12 noon.        Review of Systems   All  systems reviewed and negative except as stated in HPI  Last menstrual period 06/10/2021. General appearance: alert, cooperative, and no distress Lungs: clear to auscultation bilaterally Heart: regular rate and rhythm Abdomen: soft, non-tender; bowel sounds normal Pelvic: deferred Extremities: Homans sign is negative, no sign of DVT Presentation: breech Fetal monitoringBaseline: 130 bpm, Variability: moderate, Accelerations: present 15x15 accels, and Decelerations: Absent Uterine activity: occasional     Prenatal labs: ABO, Rh: O/Positive/-- (01/30 1102) Antibody: Negative (01/30 1102) Rubella: 19.00 (01/30 1102) RPR: Non Reactive (04/28 1202)  HBsAg: Negative (01/30 1102)  HIV: Non Reactive (04/28 1202)  GBS:   pending 1 hr Glucola abnormal -Elevated risk of down syndrome Anatomy US normal  Prenatal Transfer Tool  Maternal Diabetes: Yes:  Diabetes Type:  Insulin/Medication controlled Genetic Screening: Abnormal:  Results: Elevated risk of Trisomy 21 Maternal Ultrasounds/Referrals: Cardiac defect possible Fetal echo done 01/26/2022, limited views but no major abnormalities seen though arch sidedness and possible vascular ring remain possibilities, needs postnatal fetal echo Fetal Ultrasounds or other Referrals:  Fetal echo Maternal Substance Abuse:  No Significant Maternal Medications:  Meds include: Other:  insulin Significant Maternal Lab Results: Other: GBS pending  No results found for this or any previous visit (from the past 24 hour(s)).  Patient Active Problem List   Diagnosis Date Noted   Breech presentation 02/09/2022   Language barrier 11/11/2021   Sickle cell trait (HCC) 09/30/2021   Gestational diabetes mellitus 09/30/2021   Supervision of high risk pregnancy in first trimester 09/30/2021    Assessment/Plan:  Kristen Harvey is a 36 y.o. G3P2002 at [redacted]w[redacted]d here for external cephalic version -FWB- Cat. I -Discussed ECV- risk/benefit and alternatives  reviewed with patient including but not limited to risk of discomfort, failure and potential for urgent C-section.  Questions and concerns were addressed and desires to proceed. -If unsuccessful, plan for primary C-section.  Risk benefits and alternatives of cesarean section were discussed with the patient including but not limited to infection, bleeding, damage to bowel , bladder and baby with the need for further surgery. Pt voiced understanding and desires to proceed.   -plan for ECV with epidural  Myna Hidalgo, DO Attending Obstetrician & Gynecologist, Faculty Practice Center for [facility], [facility]

## 2022-02-24 NOTE — Lactation Note (Signed)
This note was copied from a baby's chart. Lactation Consultation Note FOB was interpreter for mom. Mom didn't BF her other 2 children. Mom stated this baby is BF good and she is supplementing w/formula after BF. Mom was holding baby STS. Baby sleeping. Mom has edema to breast, mom stated baby is latching well and BF a lot. Mom stated she didn't have any questions or concerns at this time. Mom stated she was tired. Encouraged mom to call for latch assistance if needed. We need to see latch.   Patient Name: Kristen Harvey FSFSE'L Date: 02/24/2022 Reason for consult: 1st time breastfeeding;Initial assessment;Maternal endocrine disorder;Early term 37-38.6wks Age:54 hours  Maternal Data Has patient been taught Hand Expression?: No Does the patient have breastfeeding experience prior to this delivery?: No  Feeding Nipple Type: Slow - flow  LATCH Score       Type of Nipple: Everted at rest and after stimulation (short shaft/edema)  Comfort (Breast/Nipple): Filling, red/small blisters or bruises, mild/mod discomfort (edema)         Lactation Tools Discussed/Used    Interventions Interventions: Breast feeding basics reviewed;Skin to skin;Hand express;Reverse pressure;Breast compression;LC Services brochure  Discharge    Consult Status Consult Status: Follow-up Date: 02/25/22 Follow-up type: In-patient    Charyl Dancer 02/24/2022, 10:32 PM

## 2022-02-24 NOTE — Inpatient Diabetes Management (Signed)
Inpatient Diabetes Program Recommendations  AACE/ADA: New Consensus Statement on Inpatient Glycemic Control (2015)  Target Ranges:  Prepandial:   less than 140 mg/dL      Peak postprandial:   less than 180 mg/dL (1-2 hours)      Critically ill patients:  140 - 180 mg/dL   Lab Results  Component Value Date   HGBA1C 5.7 (H) 11/01/2021    Review of Glycemic Control  Latest Reference Range & Units 05/02/21 01:23  Glucose 70 - 99 mg/dL 482 (H)  (H): Data is abnormally high  Latest Reference Range & Units 02/24/22 09:25  Glucose 70 - 99 mg/dL 63 (L)  (L): Data is abnormally low Diabetes history: Type 2 DM?  Outpatient Diabetes medications: NPH 45 units QA, 35 units QP, Regular (NT) Current orders for Inpatient glycemic control: none  Inpatient Diabetes Program Recommendations:    Consider adding CBGs TID & HS.  Would ensure patient follows up with PCP and repeats A1C in ~12 weeks.    Thanks, Lujean Rave, MSN, RNC-OB Diabetes Coordinator [phone] (8a-5p)<

## 2022-02-24 NOTE — Anesthesia Preprocedure Evaluation (Signed)
Anesthesia Evaluation  Patient identified by MRN, date of birth, ID band Patient awake    Reviewed: Allergy & Precautions, NPO status , Patient's Chart, lab work & pertinent test results  Airway Mallampati: III  TM Distance: >3 FB Neck ROM: Full    Dental no notable dental hx.    Pulmonary neg pulmonary ROS,    Pulmonary exam normal breath sounds clear to auscultation       Cardiovascular negative cardio ROS Normal cardiovascular exam Rhythm:Regular Rate:Normal     Neuro/Psych negative neurological ROS  negative psych ROS   GI/Hepatic negative GI ROS, Neg liver ROS,   Endo/Other  diabetes, Poorly Controlled, Insulin DependentMorbid obesity (BMI 40)  Renal/GU negative Renal ROS  negative genitourinary   Musculoskeletal negative musculoskeletal ROS (+)   Abdominal   Peds  Hematology  (+) Blood dyscrasia, Sickle cell trait ,   Anesthesia Other Findings Presents for version  Reproductive/Obstetrics (+) Pregnancy                             Anesthesia Physical Anesthesia Plan  ASA: 3  Anesthesia Plan: Epidural   Post-op Pain Management:    Induction:   PONV Risk Score and Plan: Treatment may vary due to age or medical condition  Airway Management Planned: Natural Airway  Additional Equipment:   Intra-op Plan:   Post-operative Plan:   Informed Consent: I have reviewed the patients History and Physical, chart, labs and discussed the procedure including the risks, benefits and alternatives for the proposed anesthesia with the patient or authorized representative who has indicated his/her understanding and acceptance.       Plan Discussed with: Anesthesiologist  Anesthesia Plan Comments: (Patient identified. Risks, benefits, options discussed with patient including but not limited to bleeding, infection, nerve damage, paralysis, failed block, incomplete pain control, headache,  blood pressure changes, nausea, vomiting, reactions to medication, itching, and post partum back pain. Confirmed with bedside nurse the patient's most recent platelet count. Confirmed with the patient that they are not taking any anticoagulation, have any bleeding history or any family history of bleeding disorders. Patient expressed understanding and wishes to proceed. All questions were answered. )        Anesthesia Quick Evaluation

## 2022-02-24 NOTE — Anesthesia Preprocedure Evaluation (Signed)
Anesthesia Evaluation  Patient identified by MRN, date of birth, ID band Patient awake    Reviewed: Allergy & Precautions, NPO status , Patient's Chart, lab work & pertinent test results  Airway Mallampati: III  TM Distance: >3 FB Neck ROM: Full    Dental no notable dental hx.    Pulmonary neg pulmonary ROS,    Pulmonary exam normal breath sounds clear to auscultation       Cardiovascular negative cardio ROS Normal cardiovascular exam Rhythm:Regular Rate:Normal     Neuro/Psych negative neurological ROS  negative psych ROS   GI/Hepatic negative GI ROS, Neg liver ROS,   Endo/Other  diabetes, Poorly Controlled, Insulin DependentMorbid obesity (BMI 40)  Renal/GU negative Renal ROS  negative genitourinary   Musculoskeletal negative musculoskeletal ROS (+)   Abdominal   Peds  Hematology negative hematology ROS (+)   Anesthesia Other Findings Failed ECV. Epidural placed for ECV and worked well.   Reproductive/Obstetrics (+) Pregnancy                             Anesthesia Physical Anesthesia Plan  ASA: 3  Anesthesia Plan: Epidural   Post-op Pain Management:    Induction:   PONV Risk Score and Plan: Treatment may vary due to age or medical condition  Airway Management Planned: Natural Airway  Additional Equipment:   Intra-op Plan:   Post-operative Plan:   Informed Consent: I have reviewed the patients History and Physical, chart, labs and discussed the procedure including the risks, benefits and alternatives for the proposed anesthesia with the patient or authorized representative who has indicated his/her understanding and acceptance.       Plan Discussed with: Anesthesiologist  Anesthesia Plan Comments: (Plan to use pre-existing epidural. )        Anesthesia Quick Evaluation

## 2022-02-24 NOTE — Procedures (Signed)
External Cephalic Version Procedure Note  Date of admission: 02/24/22 Date of procedure: 02/24/22  Surgeon: Dr. Myna Hidalgo  Procedure performed: External cephalic version   Procedure description:  After informed consent was obtained a time out was held and the procedure was verified. FHT were monitored prior to the start of the procedure and were found to be NICHD cat I. A BSUS was performed and verified that the infant was frank breech presentation with sacrum posterior and fetal head maternal right. Pt received epidural for pain management.  Terbutaline 0.25mg  IV was given prior to the start of the procedure. The breech was disengaged from the maternal pelvis and the fetus was rotated in a  forward somersault fashion. The fetus did not rotate and was unable to be rotated.  FHT were intermittently monitored during the procedure and were found to be reassuring. Due to maternal habitus and fetal position (head at level of ribs) difficulty with ability to externally manipulate and move the baby.   Procedure unsuccessful, plan for primary C-section due to fetal malpresentation.    Risk benefits and alternatives of cesarean section were discussed with the patient including but not limited to infection, bleeding, damage to bowel , bladder and baby with the need for further surgery. Pt voiced understanding and desires to proceed.    Myna Hidalgo, DO Attending Obstetrician & Gynecologist, [facility] for [employer], [facility]

## 2022-02-24 NOTE — Discharge Summary (Signed)
Postpartum Discharge Summary  Date of Service updated***     Patient Name: Kristen Harvey DOB: 1986-03-06 MRN: [id number]  Date of admission: 02/24/2022 Delivery date:02/24/2022  Delivering provider: Janyth Pupa  Date of discharge: ***  Admitting diagnosis: Fetal malpresentation, Intrauterine pregnancy, GDMA2, morbid obesity Intrauterine pregnancy: [redacted]w[redacted]d    Secondary diagnosis:  Failed version Additional problems: ***    Discharge diagnosis: Term Pregnancy Delivered and GDM A2                                              Post partum procedures:*** Augmentation: N/A Complications: None  Hospital course: Sceduled C/S   37y.o. yo G3P3003 at 38w0das admitted to the hospital 02/24/2022 for scheduled ECV.  Upon failed ECV, pt was for a scheduled cesarean section with the following indication:Malpresentation.Delivery details are as follows:  Membrane Rupture Time/Date: 4:44 PM ,02/24/2022   Delivery Method:C-Section, Low Transverse  Details of operation can be found in separate operative note.  Patient had an uncomplicated postpartum course.  She is ambulating, tolerating a regular diet, passing flatus, and urinating well. Patient is discharged home in stable condition on  02/24/22        Newborn Data: Birth date:02/24/2022  Birth time:4:45 PM  Gender:Female  Living status:Living  Apgars:8 ,8  Weight:3200 g     Magnesium Sulfate received: {Mag received:30440022} BMZ received: {BMZ received:30440023} Rhophylac:{Rhophylac received:30440032} MMIDP:{OEU:23536144}-DaP:{Tdap:23962} Flu: {F{RXV:40086}ransfusion:{Transfusion received:30440034}  Physical exam  Vitals:   02/24/22 1545 02/24/22 1547 02/24/22 1550 02/24/22 1600  BP:  113/85 113/85 105/71  Pulse: (!) 54 (!) 59 76 87  Resp:   16 16  Temp:      TempSrc:      SpO2:    100%  Weight:      Height:       General: {Exam; general:21111117} Lochia: {Desc;  appropriate/inappropriate:30686::"appropriate"} Uterine Fundus: {Desc; firm/soft:30687} Incision: {Exam; incision:21111123} DVT Evaluation: {Exam; dvt:2111122} Labs: Lab Results  Component Value Date   WBC 9.8 02/24/2022   HGB 13.9 02/24/2022   HCT 40.8 02/24/2022   MCV 81.4 02/24/2022   PLT 172 02/24/2022      Latest Ref Rng & Units 02/24/2022    9:25 AM  CMP  Glucose 70 - 99 mg/dL 63   BUN 6 - 20 mg/dL <5   Creatinine 0.44 - 1.00 mg/dL 0.64   Sodium 135 - 145 mmol/L 136   Potassium 3.5 - 5.1 mmol/L 3.4   Chloride 98 - 111 mmol/L 105   CO2 22 - 32 mmol/L 20   Calcium 8.9 - 10.3 mg/dL 8.8   Total Protein 6.5 - 8.1 g/dL 7.3   Total Bilirubin 0.3 - 1.2 mg/dL 0.5   Alkaline Phos 38 - 126 U/L 151   AST 15 - 41 U/L 20   ALT 0 - 44 U/L 15    Edinburgh Score:     No data to display           After visit meds:  Allergies as of 02/24/2022   No Known Allergies   Med Rec must be completed prior to [facility]        Discharge home in stable condition Infant Feeding: {Baby feeding:23562} Infant Disposition:{CHL IP OB HOME WITH MOPYPPJK:93267}ischarge instruction: per After Visit Summary and Postpartum booklet. Activity: Advance as tolerated. Pelvic rest for  6 weeks.  Diet: {OB BJSE:83151761} Future Appointments:No future appointments. Follow up Visit:   Please schedule this patient for a {Visit type:23955} postpartum visit in {Postpartum visit:23953} with the following provider: {Provider type:23954}. Additional Postpartum F/U:{PP Procedure:23957}  {Risk YWVPX:10626} pregnancy complicated by: {RSWNIOEVOJJK:09381} Delivery mode:  C-Section, Low Transverse  Anticipated Birth Control:  {Birth Control:23956}   02/24/2022 Annalee Genta, DO

## 2022-02-24 NOTE — Op Note (Signed)
PreOp Diagnosis: 1) Intrauterine pregnancy @ 37wk 2) Failed version, Fetal malpresentation 3) Gestational Diabetic 4) Morbid obesity  PostOp Diagnosis: same Procedure: Primary LTCS and bilateral tubal ligation Surgeon: Dr. Myna Hidalgo Assistant: Mariah Milling, MS3 Anesthesia: epidural Complications: none EBL: 300cc UOP: 200cc Fluids: 1500cc   Findings: Female infant from frank breech presentation, normal uterus, tubes and ovaries bilaterally.  PROCEDURE:  Informed consent was obtained from the patient with risks, benefits, complications, treatment options, and expected outcomes discussed with the patient.  The patient concurred with the proposed plan, giving informed consent with form signed.   The patient was taken to Operating Room, and identified with the procedure verified as C-Section Delivery with Time Out. With induction of anesthesia, the patient was prepped and draped in the usual sterile fashion. A Pfannenstiel incision was made and carried down through the subcutaneous tissue to the fascia. The fascia was incised in the midline and extended transversely. The superior aspect of the fascial incision was grasped with Kochers elevated and the underlying muscle dissected off. The inferior aspect of the facial incision was in similar fashion, grasped elevated and rectus muscles dissected off. The peritoneum was identified and entered. Peritoneal incision was extended longitudinally. Alexis retractor was placed.   A low transverse uterine incision was made and leg was identified at the hysterotomy.  The leg was flexed and delivered, sacrum delivered, the other leg was then flexed and delivered.  A blue towel was wrapped around the sacrum.  The infant was then rotated to allow for delivery of the right arm.  Baby was then rotated 180 degrees and the second arm was delivered.  The head was then flexed and delivered without complications.  After the umbilical cord was clamped and cut cord blood  was obtained for evaluation.   The placenta was removed intact and appeared normal. The uterine outline, tubes and ovaries appeared normal. The uterine incision was closed with running locked sutures of 0 Vicryl and a second layer of the same stitch was used in an imbricating fashion.    Attention was then turned to the left fallopian tube. Kelly forceps were placed on the mesosalpinx underneath most of the tube.  This pedicle was double suture ligated with a free tie and  2-0 Vicryl, and the tube including the fimbriated end was excised.  The right fallopian tube was then identified, doubly ligated, and was excised in a similar fashion allowing for bilateral tubal sterilization via bilateral salpingectomy.  Good hemostasis was noted overall.  Alexis retractor was removed.  The fascia was then reapproximated with running sutures of 0 Vicryl. The subcutaneous tissue was reapproximated with 2-0 plain gut suture.   The skin was closed with 4-0 vicryl in a subcuticular fashion.  Instrument, sponge, and needle counts were correct prior the abdominal closure and at the conclusion of the case. The patient was taken to recovery in stable condition.   Myna Hidalgo, DO Attending Obstetrician & Gynecologist, [facility] for [employer], [facility]

## 2022-02-24 NOTE — Anesthesia Procedure Notes (Signed)
Epidural Patient location during procedure: pre-op Start time: 02/24/2022 3:03 PM End time: 02/24/2022 3:10 PM  Staffing Anesthesiologist: Elmer Picker, MD Performed: anesthesiologist   Preanesthetic Checklist Completed: patient identified, IV checked, risks and benefits discussed, monitors and equipment checked, pre-op evaluation and timeout performed  Epidural Patient position: sitting Prep: DuraPrep and site prepped and draped Patient monitoring: continuous pulse ox, blood pressure, heart rate and cardiac monitor Approach: midline Location: L3-L4 Injection technique: LOR air  Needle:  Needle type: Tuohy  Needle gauge: 17 G Needle length: 9 cm Needle insertion depth: 6 cm Catheter type: closed end flexible Catheter size: 19 Gauge Catheter at skin depth: 11 cm Test dose: negative  Assessment Sensory level: T8 Events: blood not aspirated, injection not painful, no injection resistance, no paresthesia and negative IV test  Additional Notes Patient identified. Risks/Benefits/Options discussed with patient including but not limited to bleeding, infection, nerve damage, paralysis, failed block, incomplete pain control, headache, blood pressure changes, nausea, vomiting, reactions to medication both or allergic, itching and postpartum back pain. Confirmed with bedside nurse the patient's most recent platelet count. Confirmed with patient that they are not currently taking any anticoagulation, have any bleeding history or any family history of bleeding disorders. Patient expressed understanding and wished to proceed. All questions were answered. Sterile technique was used throughout the entire procedure. Please see nursing notes for vital signs. Test dose was given through epidural catheter and negative prior to continuing to dose epidural or start infusion. Warning signs of high block given to the patient including shortness of breath, tingling/numbness in hands, complete motor block,  or any concerning symptoms with instructions to call for help. Patient was given instructions on fall risk and not to get out of bed. All questions and concerns addressed with instructions to call with any issues or inadequate analgesia.  Reason for block:procedure for pain

## 2022-02-24 NOTE — Progress Notes (Signed)
Version attempt started.  Anesthesia at bedside.

## 2022-02-25 ENCOUNTER — Encounter (HOSPITAL_COMMUNITY): Payer: Self-pay | Admitting: Obstetrics & Gynecology

## 2022-02-25 LAB — CBC
HCT: 35.4 % — ABNORMAL LOW (ref 36.0–46.0)
Hemoglobin: 12.3 g/dL (ref 12.0–15.0)
MCH: 28.5 pg (ref 26.0–34.0)
MCHC: 34.7 g/dL (ref 30.0–36.0)
MCV: 81.9 fL (ref 80.0–100.0)
Platelets: 163 10*3/uL (ref 150–400)
RBC: 4.32 MIL/uL (ref 3.87–5.11)
RDW: 13.9 % (ref 11.5–15.5)
WBC: 14.6 10*3/uL — ABNORMAL HIGH (ref 4.0–10.5)
nRBC: 0 % (ref 0.0–0.2)

## 2022-02-25 LAB — GLUCOSE, CAPILLARY
Glucose-Capillary: 64 mg/dL — ABNORMAL LOW (ref 70–99)
Glucose-Capillary: 87 mg/dL (ref 70–99)
Glucose-Capillary: 95 mg/dL (ref 70–99)

## 2022-02-25 NOTE — Anesthesia Postprocedure Evaluation (Signed)
Anesthesia Post Note  Patient: Kristen Harvey  Procedure(s) Performed: CESAREAN SECTION WITH BILATERAL TUBAL LIGATION (Abdomen)     Patient location during evaluation: PACU Anesthesia Type: Epidural Level of consciousness: oriented and awake and alert Pain management: pain level controlled Vital Signs Assessment: post-procedure vital signs reviewed and stable Respiratory status: spontaneous breathing, respiratory function stable and patient connected to nasal cannula oxygen Cardiovascular status: blood pressure returned to baseline and stable Postop Assessment: no headache, no backache and no apparent nausea or vomiting Anesthetic complications: no   No notable events documented.  Last Vitals:  Vitals:   02/25/22 0151 02/25/22 0344  BP:  96/67  Pulse:  73  Resp:  16  Temp:  36.6 C  SpO2: 99% 97%    Last Pain:  Vitals:   02/25/22 0344  TempSrc: Oral  PainSc:    Pain Goal: Patients Stated Pain Goal: 4 (02/24/22 1739)                 Kristen Harvey Kristen Harvey

## 2022-02-25 NOTE — Lactation Note (Signed)
This note was copied from a baby's chart. Lactation Consultation Note  Patient Name: Kristen Harvey [id number] Date: 02/25/2022 Reason for consult: Follow-up assessment Age:34 hours  In house interpreter was not reachable, tried twice using vocera, so IPAD used.  Mom states she wants to BF.  Baby has narrow gape and does not open wide to latch.  Multiple attempts made. Baby would grasp the breast, take a few sucks, then slip off.  LC supported chin with latch to help flange bottom llip and this allowed baby to get a good seal and sustain latch.  Baby was able to latch for 5 minutes of rhythmic sucking but LC assistance was needed.  Mom was unable to latch independently.   LC encouraged mom to supplement with formula, if baby is coming on and off the breast throughout the next feeding.  Baby was taken for Echo while LC was in the room.  Mom Discussed paced feeding and importance of baby latching deeply on the bottle nipple; not just the tip.  Encouraged mom to call out when baby feeds again for assistance with latching since improvement in suck was noted during this consult.      Maternal Data Has patient been taught Hand Expression?: Yes Does the patient have breastfeeding experience prior to this delivery?: No (did not BF two previous children)  Feeding Mother's Current Feeding Choice: Breast Milk (mom states her goal is to breastfeed only)  LATCH Score Latch: Repeated attempts needed to sustain latch, nipple held in mouth throughout feeding, stimulation needed to elicit sucking reflex.  Audible Swallowing: None  Type of Nipple: Everted at rest and after stimulation  Comfort (Breast/Nipple): Soft / non-tender  Hold (Positioning): Assistance needed to correctly position infant at breast and maintain latch.  LATCH Score: 6   Lactation Tools Discussed/Used    Interventions Interventions: Breast feeding basics reviewed;Skin to skin;Assisted with latch;Hand express;Position  options;Support pillows  Discharge    Consult Status Consult Status: Follow-up Date: 02/26/22 Follow-up type: In-patient    Maryruth Hancock [facility] 02/25/2022, 2:06 PM

## 2022-02-25 NOTE — Anesthesia Postprocedure Evaluation (Signed)
Anesthesia Post Note  Patient: Benjie Karvonen [name]  Procedure(s) Performed: VERSION        Patient location during evaluation: PACU Anesthesia Type: Epidural Level of consciousness: oriented and awake and alert Pain management: pain level controlled Vital Signs Assessment: post-procedure vital signs reviewed and stable Respiratory status: spontaneous breathing, respiratory function stable and patient connected to nasal cannula oxygen Cardiovascular status: blood pressure returned to baseline and stable Postop Assessment: no headache, no backache and no apparent nausea or vomiting Anesthetic complications: no   No notable events documented.  Last Vitals:  Vitals:   02/25/22 0151 02/25/22 0344  BP:  96/67  Pulse:  73  Resp:  16  Temp:  36.6 C  SpO2: 99% 97%    Last Pain:  Vitals:   02/25/22 0344  TempSrc: Oral  PainSc:    Pain Goal: Patients Stated Pain Goal: 4 (02/24/22 1739)                 Auburn Hester L Seynabou Fults

## 2022-02-25 NOTE — Progress Notes (Addendum)
Subjective: Postpartum Day 1: Cesarean Delivery Patient reports incisional pain.    Objective: Vital signs in last 24 hours: Temp:  [97 F (36.1 C)-98.9 F (37.2 C)] 97.9 F (36.6 C) (07/08 0344) Pulse Rate:  [54-97] 73 (07/08 0344) Resp:  [7-20] 16 (07/08 0344) BP: (63-113)/(49-85) 96/67 (07/08 0344) SpO2:  [92 %-100 %] 97 % (07/08 0344)  Physical Exam:  General: alert, cooperative, and no distress Lochia: appropriate Uterine Fundus: firm Incision: small amount of dark blood staining on honeycomb, already marked by RN and no change since DVT Evaluation: No evidence of DVT seen on physical exam.  Recent Labs    02/24/22 0925 02/25/22 0525  HGB 13.9 12.3  HCT 40.8 35.4*    Assessment/Plan: Status post Cesarean section. Doing well postoperatively.  Continue current care. AM CBG 64 and patient eating upon assessment. Will recheck  Encouraged patient to ambulate frequently and take pain medication as ordered to help with incisional pain. Will let team know if pain is not well controlled.   Rolm Bookbinder, CNM 02/25/2022, 10:06 AM

## 2022-02-26 LAB — CULTURE, BETA STREP (GROUP B ONLY): Strep Gp B Culture: NEGATIVE

## 2022-02-26 LAB — GLUCOSE, CAPILLARY: Glucose-Capillary: 92 mg/dL (ref 70–99)

## 2022-02-26 NOTE — Progress Notes (Signed)
A consult was placed for MOB due to an [place] Postnatal Depression Screen score of 9.   * Referral screened out by Clinical Social Worker because none of the following criteria appear to apply: ~ History of anxiety/depression during this pregnancy, or of post-partum depression following prior delivery. ~ Diagnosis of anxiety and/or depression within last 3 years OR * MOB's symptoms currently being treated with medication and/or therapy. Please contact the Clinical Social Worker if needs arise, by [facility] request, or if MOB scores greater than 9/yes to question 10 on Edinburgh Postpartum Depression Screen.  Signed,  Norberto Sorenson, MSW, LCSWA, LCASA 02/26/2022 3:04 PM

## 2022-02-26 NOTE — Progress Notes (Signed)
POSTPARTUM PROGRESS NOTE  POD #2  Subjective:  Kristen Harvey is a 36 y.o. [id number] s/p primary LTCS at [redacted]w[redacted]d.  She reports she doing well. No acute events overnight.  She denies any problems with ambulating, voiding or po intake. Denies nausea or vomiting. She has not passed flatus. Pain is well controlled.  Lochia is normal.  Objective: Blood pressure 94/61, pulse 82, temperature 98.6 F (37 C), temperature source Oral, resp. rate 16, height 5\' 4"  (1.626 m), weight 105.7 kg, last menstrual period 06/10/2021, SpO2 97 %, unknown if currently breastfeeding.  Physical Exam:  General: alert, cooperative and no distress Chest: no respiratory distress, CTAB Heart:regular rate and rhythm distal pulses intact Abdomen: soft, nontender, nondistedend, obese, positive bowel sounds Uterine Fundus: firm, appropriately tender DVT Evaluation: No calf swelling or tenderness Extremities: trace edema Skin: warm, dry; incision clean/dry/intact w/ honeycomb dressing in place  Recent Labs    02/24/22 0925 02/25/22 0525  HGB 13.9 12.3  HCT 40.8 35.4*    Assessment/Plan: Kristen Harvey is a 37 y.o. [phone] s/p primary LTCS at [redacted]w[redacted]d for fetal malpresentation, obesity and gestational diabetes.  POD#2 -  Contraception: BTL Feeding: breast  Pt advised to be out of bed, increase ambulation to aid in improving bowel function. Anticipate d/c on 02/27/22 with improved bowel function   LOS: 2 days   04/30/22, Md Faculty Attending, Center for [facility] 02/26/2022, 10:06 AM

## 2022-02-26 NOTE — Lactation Note (Addendum)
This note was copied from a baby's chart. Lactation Consultation Note  Patient Name: Kristen Harvey [id number] Date: 02/26/2022 Reason for consult: Follow-up assessment;Mother's request;Difficult latch;1st time breastfeeding;Early term 37-38.6wks;Infant weight loss;Breastfeeding assistance;Maternal endocrine disorder Age:34 hours  LC set up DEBP and Mom pumped 56 mls after first pumping session. LC alerted provider, Greer Ee stated it would be ok for Mom to just supplement with her pumped milk after latching, instead of 22 cal formula.   LC not able to observe a latch, infant took 55 ml of formula. We reviewed feeding volumes after breastfeeding. Dad aware to give less after latch for next feeding, advance as tolerated.   Dad served as Nurse, learning disability during the visit.   Plan 1. To feed based on cues 8-12x 24hr period. Mom to offer breasts and look for signs of milk transfer.  2. Mom to supplement with EBM with pace bottle feeding and slow flow nipple. BF supplementation guide provided.  3 Post pump after feeding for 15 mins on initial setting. Parents aware to change to maintenance setting if Mom gets 20 ml or more pumping three times in a row.   Mom verbalized understanding of summary of visit written above.   All questions answered at the end of the visit.   Maternal Data Has patient been taught Hand Expression?: Yes  Feeding Mother's Current Feeding Choice: Breast Milk Nipple Type: Slow - flow  LATCH Score                    Lactation Tools Discussed/Used Tools: Pump;Flanges Flange Size: 24 Breast pump type: Double-Electric Breast Pump Pump Education: Setup, frequency, and cleaning;Milk Storage Reason for Pumping: increase stimulation Pumping frequency: post pump after feeding for 15 mins  Interventions Interventions: Breast feeding basics reviewed;Hand express;Expressed milk;DEBP;Education;Pace feeding;Infant Driven Arts administrator  Discharge Pump: DEBP WIC Program: Yes  Consult Status Consult Status: Follow-up Date: 02/27/22 Follow-up type: In-patient    Kristen Harvey  Kristen Harvey 02/26/2022, 6:31 PM

## 2022-02-26 NOTE — Lactation Note (Signed)
This note was copied from a baby's chart. Lactation Consultation Note  Patient Name: Girl Malini Flemings [id number] Date: 02/26/2022   Age:34 hours Spanish translator Roddie Mc assisted with communication with Mom.   Mom does have WIC and does not have a pump for home.  LC talked with Mom with help of Spanish translator to see how feeding going. LC updated with help of translator Mothers I and O sheet of feedings, urine and stool output.   Mom had a recent feeding and infant resting comfortably on arrival.  Mom to call for latch assistance with next feeding.   Maternal Data    Feeding    LATCH Score                    Lactation Tools Discussed/Used    Interventions    Discharge    Consult Status      Laiyla Slagel  [name] 02/26/2022, 4:22 PM

## 2022-02-27 ENCOUNTER — Encounter (HOSPITAL_COMMUNITY): Payer: Self-pay | Admitting: Obstetrics & Gynecology

## 2022-02-27 DIAGNOSIS — Z302 Encounter for sterilization: Secondary | ICD-10-CM

## 2022-02-27 MED ORDER — IBUPROFEN 600 MG PO TABS: 600.0000 mg | ORAL_TABLET | Freq: Four times a day (QID) | ORAL | 0 refills | Status: AC | PRN

## 2022-02-27 MED ORDER — OXYCODONE HCL 5 MG PO TABS: 5.0000 mg | ORAL_TABLET | ORAL | 0 refills | Status: AC | PRN

## 2022-02-27 NOTE — Lactation Note (Signed)
This note was copied from a baby's chart. Lactation Consultation Note  Patient Name: Kristen Harvey [id number] Date: 02/27/2022 Reason for consult: Follow-up assessment;Early term 37-38.6wks;Infant weight loss;Breastfeeding assistance (7.06% WL) Age:34 hours  P3, Early Term, Infant Female, 7.06% WL  LC entered the room and baby was asleep in the bassinet. Mom states that things are going well with breastfeeding and she has no concerns. Mom asked if she could get a manual pump because she does not have a pump at home. LC asked mom repeatedly if she would like a [facility] loaner pump. Mom stated that she has an appointment with WIC in 2 weeks and they will give her a pump then.   Mom was given a manual pump.   LC spoke with mom about engorgement, warning signs, infant I/O, and milk storage.   Mom says that she was taught about washing pump parts.   Mom says that she has no further questions or concerns.   Current Feeding Plan: Breastfeed 8+ times in 24 hours according to feeding cues.  Put baby to the breast first before supplementing.  Pump after feeding and feed expressed milk to baby via a bottle.  Watch baby's output and call the pediatrician with questions or concerns.  Call outpatient [facility] for assistance with breastfeeding.      Lactation Tools Discussed/Used Tools: Pump Breast pump type: Manual Pump Education: Milk Storage (Mom states that she has been taught about cleaning pump parts.) Reason for Pumping: Mom does not have a pump at home  Interventions Interventions: Breast feeding basics reviewed;Education  Discharge Discharge Education: Engorgement and breast care;Warning signs for feeding baby;Outpatient recommendation Pump: Manual  Consult Status Consult Status: Complete Date: 02/27/22 Follow-up type: Call as needed    Delene Loll 02/27/2022, 2:20 PM

## 2022-02-27 NOTE — Discharge Summary (Signed)
Visit with [facility] video interpreter [phone]    Postpartum Discharge Summary      Patient Name: Kristen Harvey DOB: 25-Dec-1985 MRN: [id number]  Date of admission: 02/24/2022 Delivery date:02/24/2022  Delivering provider: Janyth Pupa  Date of discharge: 02/27/2022  Admitting diagnosis: Intrauterine pregnancy [Z34.90] Intrauterine pregnancy: [redacted]w[redacted]d    Secondary diagnosis:  Principal Problem:   Intrauterine pregnancy Active Problems:   Sickle cell trait (H[place]cher   Gestational diabetes mellitus   Language barrier   Breech presentation   Request for sterilization  Additional problems: none    Discharge diagnosis: Term Pregnancy Delivered                                              Post partum procedures:postpartum tubal ligation Augmentation: N/A Complications: None  Hospital course: Scheduled C/S   36y.o. yo G3P3003 at 316w0das admitted to the hospital 02/24/2022 for scheduled cesarean section with the following indication: fetal malpresentation with unsuccessful ECV in the setting of GDMA2 (likely T2DM) .Delivery details are as follows:  Membrane Rupture Time/Date: 4:44 PM ,02/24/2022   Delivery Method:C-Section, Low Transverse  Details of operation can be found in separate operative note.  Patient had an uncomplicated postpartum course.  Her POD#1 Hgb was 12.3 and had been 13.9 on admission. Her fasting CBG was 64. She is ambulating, tolerating a regular diet, passing flatus, and urinating well. Patient is discharged home in stable condition on  02/27/22        Newborn Data: Birth date:02/24/2022  Birth time:4:45 PM  Gender:Female  Living status:Living  Apgars:8 ,8  Weight:3200 g (7lb 0.9oz)    Magnesium Sulfate received: No BMZ received: No Rhophylac:N/A MMR:N/A T-DaP:Given prenatally Flu: Yes Transfusion:No  Physical exam  Vitals:   02/25/22 1957 02/26/22 1500 02/26/22 2000 02/27/22 0550  BP: 94/61 116/83 107/72 103/71  Pulse: 82 74 73 67  Resp: 16 18 18 18    Temp: 98.6 F (37 C) 98.4 F (36.9 C) 98.3 F (36.8 C) 98.2 F (36.8 C)  TempSrc: Oral Oral Oral Oral  SpO2:   98% 99%  Weight:      Height:       General: alert and cooperative Lochia: appropriate Uterine Fundus: firm Incision: honeycomb intact and dry DVT Evaluation: No evidence of DVT seen on physical exam. Labs: Lab Results  Component Value Date   WBC 14.6 (H) 02/25/2022   HGB 12.3 02/25/2022   HCT 35.4 (L) 02/25/2022   MCV 81.9 02/25/2022   PLT 163 02/25/2022      Latest Ref Rng & Units 02/24/2022    9:25 AM  CMP  Glucose 70 - 99 mg/dL 63   BUN 6 - 20 mg/dL <5   Creatinine 0.44 - 1.00 mg/dL 0.64   Sodium 135 - 145 mmol/L 136   Potassium 3.5 - 5.1 mmol/L 3.4   Chloride 98 - 111 mmol/L 105   CO2 22 - 32 mmol/L 20   Calcium 8.9 - 10.3 mg/dL 8.8   Total Protein 6.5 - 8.1 g/dL 7.3   Total Bilirubin 0.3 - 1.2 mg/dL 0.5   Alkaline Phos 38 - 126 U/L 151   AST 15 - 41 U/L 20   ALT 0 - 44 U/L 15    Edinburgh Score:    02/26/2022    7:34 AM  Edinburgh Postnatal Depression Scale Screening Tool  I  have been able to laugh and see the funny side of things. 2  I have looked forward with enjoyment to things. 1  I have blamed myself unnecessarily when things went wrong. 1  I have been anxious or worried for no good reason. 1  I have felt scared or panicky for no good reason. 1  Things have been getting on top of me. 1  I have been so unhappy that I have had difficulty sleeping. 0  I have felt sad or miserable. 1  I have been so unhappy that I have been crying. 1  The thought of harming myself has occurred to me. 0  Edinburgh Postnatal Depression Scale Total 9     After visit meds:  Allergies as of 02/27/2022   No Known Allergies      Medication List     STOP taking these medications    aspirin EC 81 MG tablet   insulin NPH Human 100 UNIT/ML injection Commonly known as: NOVOLIN N   insulin regular 100 units/mL injection Commonly known as: NovoLIN R    loratadine 10 MG tablet Commonly known as: Claritin   TRUEplus Insulin Syringe 31G X 5/16" 0.3 ML Misc Generic drug: Insulin Syringe-Needle U-100       TAKE these medications    ibuprofen 600 MG tablet Commonly known as: ADVIL Take 1 tablet (600 mg total) by mouth every 6 (six) hours as needed.   multivitamin-prenatal 27-0.8 MG Tabs tablet Take 1 tablet by mouth daily at 12 noon.   oxyCODONE 5 MG immediate release tablet Commonly known as: Oxy IR/ROXICODONE Take 1-2 tablets (5-10 mg total) by mouth every 4 (four) hours as needed for moderate pain.         Discharge home in stable condition Infant Feeding: Breast Infant Disposition:home with mother Discharge instruction: per After Visit Summary and Postpartum booklet. Activity: Advance as tolerated. Pelvic rest for 6 weeks.  Diet: carb modified diet Future Appointments:No future appointments. Follow up Visit:  [place] [facility] [place] Follow up.   Specialty: Obstetrics and Gynecology Why: You will be contacted for an incision check in 1-2 weeks and a postpartum visit in 4-6 weeks. Contact information: [place] [id number] [phone]                Myrtis Ser, CNM  P [facility] Admin Pool Please schedule this patient for Postpartum visit in: 6 weeks with the following provider: Any provider  In-Person  For C/S patients schedule nurse incision check in weeks 2 weeks: yes  High risk pregnancy complicated by: GDM  Delivery mode:  CS  Anticipated Birth Control:  BTL done PP  PP Procedures needed: incision check in 1-2wks; 2hr GTT in 6 weeks  Schedule Integrated BH visit: no    02/27/2022 Myrtis Ser, CNM 8:02 AM

## 2022-02-28 LAB — SURGICAL PATHOLOGY

## 2022-03-01 ENCOUNTER — Encounter: Payer: Self-pay | Admitting: Obstetrics and Gynecology

## 2022-03-01 ENCOUNTER — Ambulatory Visit: Payer: Self-pay

## 2022-03-06 ENCOUNTER — Telehealth (HOSPITAL_COMMUNITY): Payer: Self-pay | Admitting: *Deleted

## 2022-03-06 ENCOUNTER — Ambulatory Visit: Payer: Self-pay

## 2022-03-06 NOTE — Telephone Encounter (Signed)
Attempted Hospital Discharge Follow-Up Phone Call with help of telephonic interpreter "Judie Grieve [phone]".  When patient's line picked up it said that the number is not accepting phone calls at this time.  Unable to leave a message.

## 2022-03-09 ENCOUNTER — Other Ambulatory Visit: Payer: Self-pay

## 2022-03-09 ENCOUNTER — Encounter: Payer: Self-pay | Admitting: Obstetrics and Gynecology

## 2022-03-16 ENCOUNTER — Encounter: Payer: Self-pay | Admitting: Obstetrics and Gynecology

## 2022-04-06 ENCOUNTER — Ambulatory Visit: Payer: Self-pay | Admitting: Obstetrics and Gynecology

## 2022-04-06 ENCOUNTER — Other Ambulatory Visit: Payer: Self-pay

## 2022-04-18 ENCOUNTER — Encounter (HOSPITAL_COMMUNITY): Payer: Self-pay | Admitting: Emergency Medicine

## 2022-04-18 ENCOUNTER — Emergency Department (HOSPITAL_COMMUNITY)
Admission: EM | Admit: 2022-04-18 | Discharge: 2022-04-19 | Disposition: A | Discharge status: Home / Self Care | Payer: Self-pay | Attending: Emergency Medicine | Admitting: Emergency Medicine

## 2022-04-18 ENCOUNTER — Other Ambulatory Visit: Payer: Self-pay

## 2022-04-18 DIAGNOSIS — N939 Abnormal uterine and vaginal bleeding, unspecified: Secondary | ICD-10-CM | POA: Insufficient documentation

## 2022-04-18 DIAGNOSIS — E119 Type 2 diabetes mellitus without complications: Secondary | ICD-10-CM | POA: Insufficient documentation

## 2022-04-18 LAB — COMPREHENSIVE METABOLIC PANEL
ALT: 43 U/L (ref 0–44)
AST: 37 U/L (ref 15–41)
Albumin: 3.8 g/dL (ref 3.5–5.0)
Alkaline Phosphatase: 115 U/L (ref 38–126)
Anion gap: 6 (ref 5–15)
BUN: 14 mg/dL (ref 6–20)
CO2: 23 mmol/L (ref 22–32)
Calcium: 9.4 mg/dL (ref 8.9–10.3)
Chloride: 110 mmol/L (ref 98–111)
Creatinine, Ser: 0.99 mg/dL (ref 0.44–1.00)
GFR, Estimated: >60 mL/min (ref >60)
Glucose, Bld: 122 mg/dL — ABNORMAL HIGH (ref 70–99)
Potassium: 3.9 mmol/L (ref 3.5–5.1)
Sodium: 139 mmol/L (ref 135–145)
Total Bilirubin: 0.3 mg/dL (ref 0.3–1.2)
Total Protein: 8.1 g/dL (ref 6.5–8.1)

## 2022-04-18 LAB — CBC WITH DIFFERENTIAL/PLATELET
Abs Immature Granulocytes: 0.02 10*3/uL (ref 0.00–0.07)
Basophils Absolute: 0 10*3/uL (ref 0.0–0.1)
Basophils Relative: 0 %
Eosinophils Absolute: 0.2 10*3/uL (ref 0.0–0.5)
Eosinophils Relative: 3 %
HCT: 37.9 % (ref 36.0–46.0)
Hemoglobin: 12.9 g/dL (ref 12.0–15.0)
Immature Granulocytes: 0 %
Lymphocytes Relative: 34 %
Lymphs Abs: 2.6 10*3/uL (ref 0.7–4.0)
MCH: 28.3 pg (ref 26.0–34.0)
MCHC: 34 g/dL (ref 30.0–36.0)
MCV: 83.1 fL (ref 80.0–100.0)
Monocytes Absolute: 0.6 10*3/uL (ref 0.1–1.0)
Monocytes Relative: 7 %
Neutro Abs: 4.3 10*3/uL (ref 1.7–7.7)
Neutrophils Relative %: 56 %
Platelets: 238 10*3/uL (ref 150–400)
RBC: 4.56 MIL/uL (ref 3.87–5.11)
RDW: 14.6 % (ref 11.5–15.5)
WBC: 7.7 10*3/uL (ref 4.0–10.5)
nRBC: 0 % (ref 0.0–0.2)

## 2022-04-18 LAB — TYPE AND SCREEN
ABO/RH(D): O POS
Antibody Screen: NEGATIVE

## 2022-04-18 LAB — I-STAT BETA HCG BLOOD, ED (MC, WL, AP ONLY): I-stat hCG, quantitative: <5 m[IU]/mL (ref <5)

## 2022-04-18 NOTE — ED Provider Triage Note (Addendum)
Emergency Medicine Provider Triage Evaluation Note  Kristen Harvey , a 36 y.o. female  was evaluated in triage.  Pt complains of vaginal bleeding x 3 days. Progressively worsening, now passing clots and changing her pad every 5 minutes. Associated lightheadedness, nausea, and chills. Recent c section 02/24/22 S/p tubal ligation as well at that time. Not on anticoagulation. Denies syncope or vomiting. Prior to bleeding x 3 days no period since delivery.   Review of Systems  Per above.   Physical Exam  BP 119/86 (BP Location: Right Arm)   Pulse 83   Temp 98.4 F (36.9 C) (Oral)   Resp 15   LMP 06/10/2021   SpO2 95%  Gen:   Awake, no distress   Resp:  Normal effort  MSK:   Moves extremities without difficulty  Other:  Mild suprapubic TTP.   Medical Decision Making  Medically screening exam initiated at 10:12 PM.  Appropriate orders placed.  Stormy Fabian Munguia Rodas was informed that the remainder of the evaluation will be completed by another provider, this initial triage assessment does not replace that evaluation, and the importance of remaining in the ED until their evaluation is complete.  Vaginal bleeding.   Interpretor utilized throughout Audiological scientist.    Cherly Anderson, PA-C 04/18/22 2214    Cherly Anderson, [place] 04/18/22 2215

## 2022-04-18 NOTE — ED Triage Notes (Addendum)
Pt is post partum July 7th, has been having heavy vaginal bleeding (clots) X3 days.  Also c/o nausea and no bowel movement yesterday.  Has been changing pads every 5 minutes, now using towels.  Pt reports fever, abdominal pain, dizziness and nausea.   126/86 HR 93 RR 16 95%RA  CBG 187

## 2022-04-19 NOTE — ED Provider Notes (Signed)
[facility]ENCY DEPT [facility] Emergency Department Provider Note MRN:  [id number]  Arrival date & time: 04/19/22     Chief Complaint   Vaginal Bleeding   History of Present Illness   Kristen Harvey is a 36 y.o. year-old female with a history of diabetes presenting to the ED with chief complaint of vaginal bleeding.  C-section delivery of a child last month, July 8.  Has not had any vaginal bleeding until 3 days ago.  Felt like a normal menstrual cycle but this evening was heavier than normal.  No abdominal pain, no other complaints.  Review of Systems  A thorough review of systems was obtained and all systems are negative except as noted in the HPI and PMH.   Patient's Health History    Past Medical History:  Diagnosis Date   Diabetes mellitus without complication (HCC)    Fatty liver    Gastritis     Past Surgical History:  Procedure Laterality Date   CESAREAN SECTION WITH BILATERAL TUBAL LIGATION N/A 02/24/2022   Procedure: CESAREAN SECTION WITH BILATERAL TUBAL LIGATION;  Surgeon: Myna Hidalgo, DO;  Location: [facility] LD ORS;  Service: Obstetrics;  Laterality: N/A;   NO PAST SURGERIES      Family History  Problem Relation Age of Onset   Hypertension Mother    Diabetes Mother    Hypertension Father    Diabetes Father    Hypertension Maternal Aunt    Diabetes Maternal Aunt    Hypertension Maternal Uncle    Diabetes Maternal Uncle    Hypertension Paternal Aunt    Diabetes Paternal Aunt    Hypertension Paternal Uncle    Diabetes Paternal Uncle    Hypertension Maternal Grandmother    Diabetes Maternal Grandmother    Hypertension Maternal Grandfather    Diabetes Maternal Grandfather    Hypertension Paternal Grandmother    Diabetes Paternal Grandmother    Hypertension Paternal Grandfather    Diabetes Paternal Grandfather     Social History   Socioeconomic History   Marital status: Single    Spouse name: Not on file   Number of children: Not on file    Years of education: Not on file   Highest education level: Not on file  Occupational History   Not on file  Tobacco Use   Smoking status: Never   Smokeless tobacco: Never  Vaping Use   Vaping Use: Never used  Substance and Sexual Activity   Alcohol use: Not Currently   Drug use: Never   Sexual activity: Yes  Other Topics Concern   Not on file  Social History Narrative   Not on file   Social Determinants of Health   Financial Resource Strain: Not on file  Food Insecurity: Food Insecurity Present (02/22/2022)   Hunger Vital Sign    Worried About Running Out of Food in the Last Year: Sometimes true    Ran Out of Food in the Last Year: Never true  Transportation Needs: No Transportation Needs (02/22/2022)   PRAPARE - Administrator, Civil Service (Medical): No    Lack of Transportation (Non-Medical): No  Physical Activity: Not on file  Stress: Not on file  Social Connections: Not on file  Intimate Partner Violence: Not on file     Physical Exam   Vitals:   04/19/22 0400 04/19/22 0415  BP: 112/79 110/76  Pulse: 69 72  Resp:  14  Temp:    SpO2: 100% 100%    CONSTITUTIONAL: Well-appearing, NAD  NEURO/PSYCH:  Alert and oriented x 3, no focal deficits EYES:  eyes equal and reactive ENT/NECK:  no LAD, no JVD CARDIO: Regular rate, well-perfused, normal S1 and S2 PULM:  CTAB no wheezing or rhonchi GI/GU:  non-distended, non-tender MSK/SPINE:  No gross deformities, no edema SKIN:  no rash, atraumatic   *Additional and/or pertinent findings included in MDM below  Diagnostic and Interventional Summary    EKG Interpretation  Date/Time:  Tuesday April 18 2022 22:33:57 EDT Ventricular Rate:  79 PR Interval:  166 QRS Duration: 84 QT Interval:  356 QTC Calculation: 408 R Axis:   -7 Text Interpretation: Normal sinus rhythm Normal ECG No previous ECGs available Confirmed by Kennis Carina [phone]) on 04/19/2022 3:55:38 AM       Labs Reviewed  COMPREHENSIVE  METABOLIC PANEL - Abnormal; Notable for the following components:      Result Value   Glucose, Bld 122 (*)    All other components within normal limits  CBC WITH DIFFERENTIAL/PLATELET  I-STAT BETA HCG BLOOD, ED ([facility], [facility], [facility] ONLY)  TYPE AND SCREEN    No orders to display    Medications - No data to display   Procedures  /  Critical Care Procedures  ED Course and Medical Decision Making  Initial Impression and Ddx Suspect menstrual cycle as the cause of patient's bleeding.  hCG is negative.  Given the report that it is very heavy bleeding, will check H&H.  No fever, normal vital signs, benign abdominal exam, doubt retained products or any other infectious emergent process.  Past medical/surgical history that increases complexity of ED encounter: None  Interpretation of Diagnostics I personally reviewed the laboratory assessment and my interpretation is as follows: No significant blood count or electrolyte disturbance    Patient Reassessment and Ultimate Disposition/Management     Discharge  Patient management required discussion with the following services or consulting groups:  None  Complexity of Problems Addressed Acute illness or injury that poses threat of life of bodily function  Additional Data Reviewed and Analyzed Further history obtained from: Prior labs/imaging results  Additional Factors Impacting ED Encounter Risk None  Elmer Sow. Pilar Plate, MD [facility] Emergency Medicine [facility] [email or web address]  Final Clinical Impressions(s) / ED Diagnoses     ICD-10-CM   1. Vaginal bleeding  N93.9       ED Discharge Orders     None        Discharge Instructions Discussed with and Provided to Patient:    Discharge Instructions      You were evaluated in the Emergency Department and after careful evaluation, we did not find any emergent condition requiring admission or further testing in the hospital.  Your exam/testing today was  overall reassuring.  Recommend follow-up with your OB/GYN to discuss her symptoms.  Please return to the Emergency Department if you experience any worsening of your condition.  Thank you for allowing [place] to be a part of your care.       Sabas Sous, MD 04/19/22 [phone]

## 2022-04-19 NOTE — Discharge Instructions (Signed)
You were evaluated in the Emergency Department and after careful evaluation, we did not find any emergent condition requiring admission or further testing in the hospital.  Your exam/testing today was overall reassuring.  Recommend follow-up with your OB/GYN to discuss her symptoms.  Please return to the Emergency Department if you experience any worsening of your condition.  Thank you for allowing [place] to be a part of your care.

## 2022-04-19 NOTE — ED Notes (Signed)
Patient verbalizes understanding of d/c instructions. Opportunities for questions and answers were provided. Pt d/c from ED and ambulated to lobby where she is calling for a taxi.

## 2022-10-26 IMAGING — CT CT HEAD W/O CM
3 series · 16 of 47 positions shown, 19 images · non-contrast
Comparison: None.

CLINICAL DATA: 35-year-old female with increasing headache.
Dizziness and vomiting.

EXAM:
CT HEAD WITHOUT CONTRAST
TECHNIQUE: Contiguous axial images were obtained from the base of the skull
through the vertex without intravenous contrast.

[Series 3: head wo · axial · 0.44mm/px · z∈[-149,-24]mm · 10 of 31 slices shown, 13 images]
[im 3/31  brain]
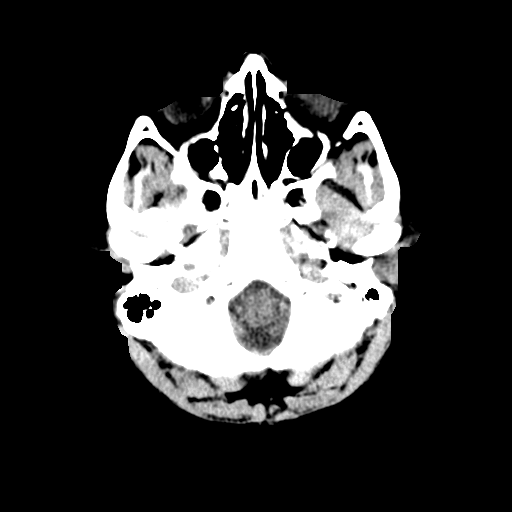
[im 3/31  bone]
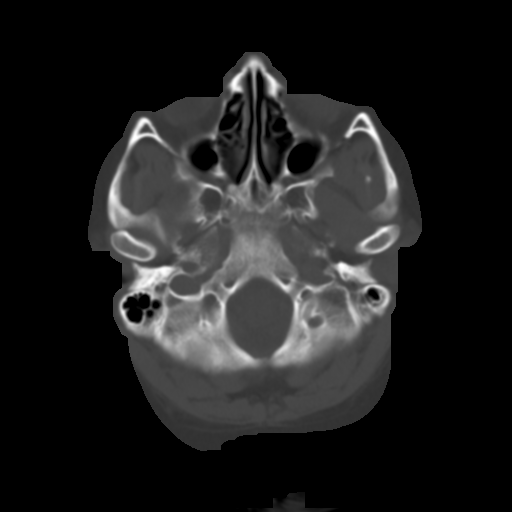
[im 6/31  brain]
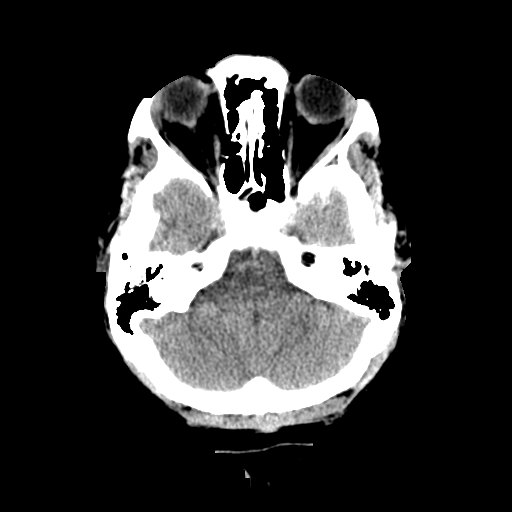
[im 9/31  brain]
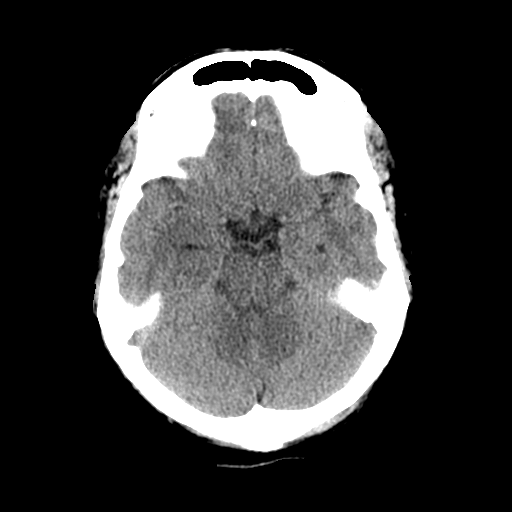
[im 11/31  brain]
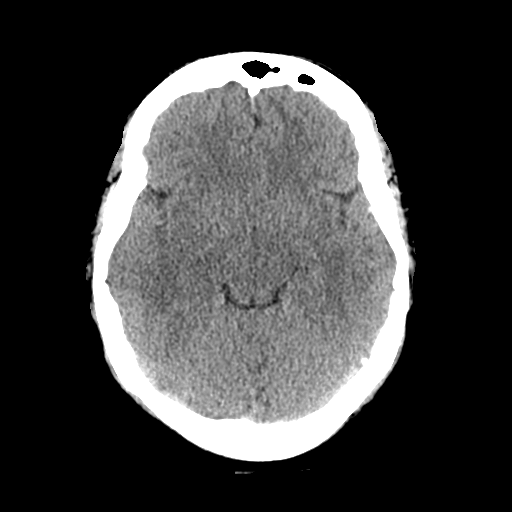
[im 14/31  brain]
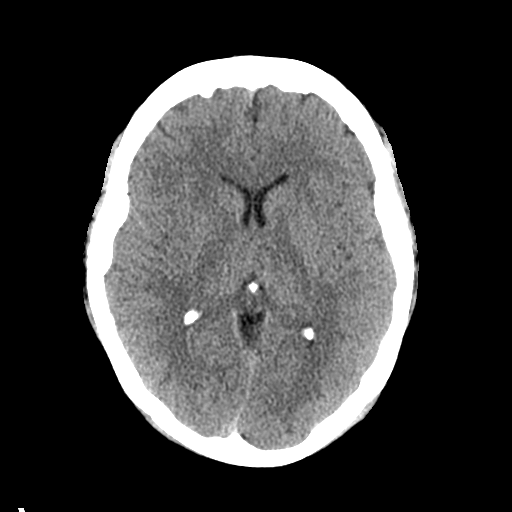
[im 14/31  bone]
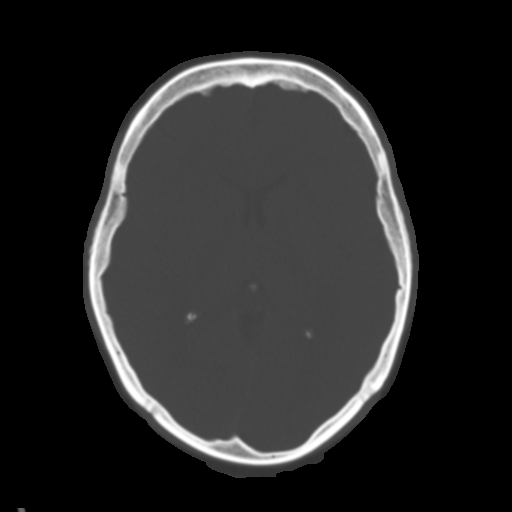
[im 17/31  brain]
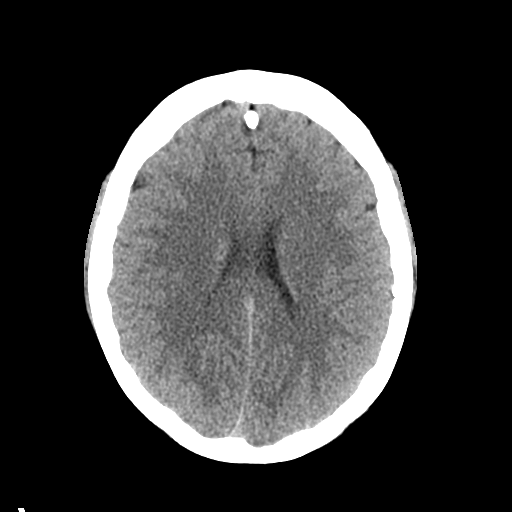
[im 20/31  brain]
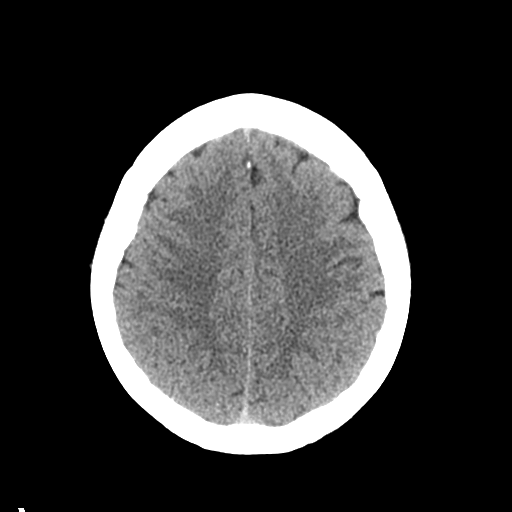
[im 23/31  brain]
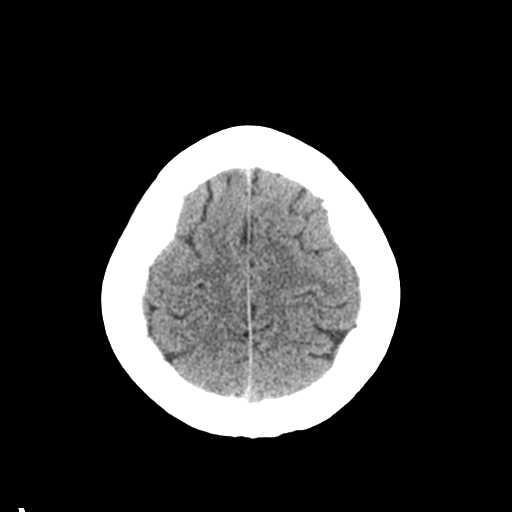
[im 25/31  brain]
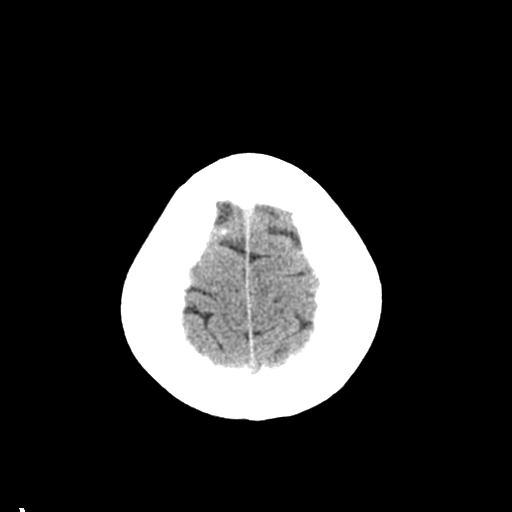
[im 25/31  bone]
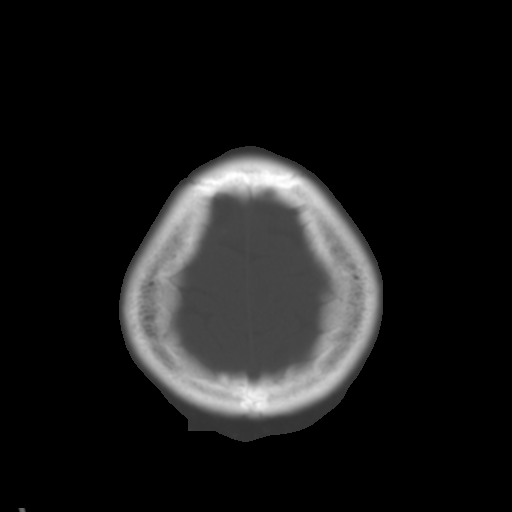
[im 28/31  brain]
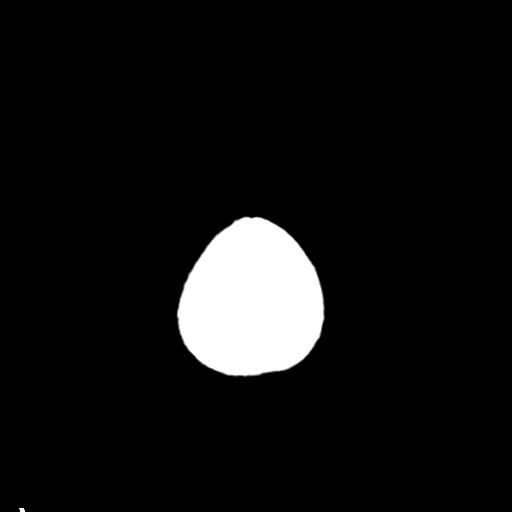

[Series 5: coronal soft tissue · coronal · 0.32mm/px · 3 of 70 slices shown]
[im 24/70  brain]
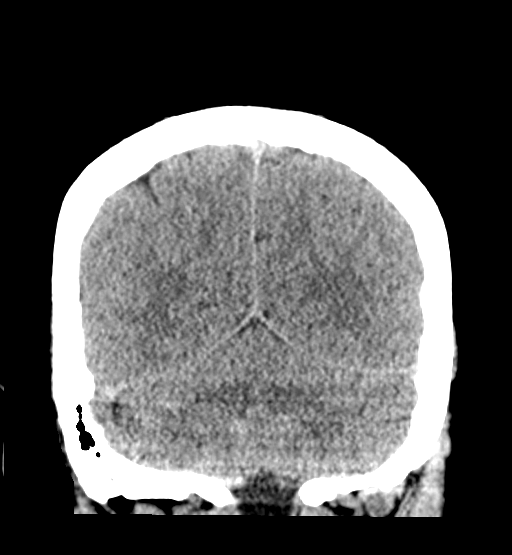
[im 31/70  brain]
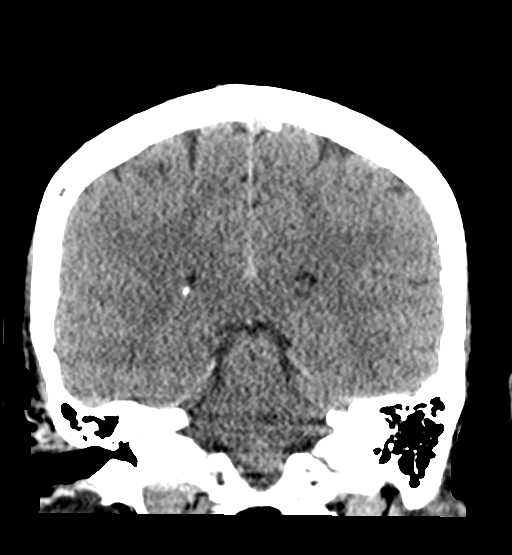
[im 39/70  brain]
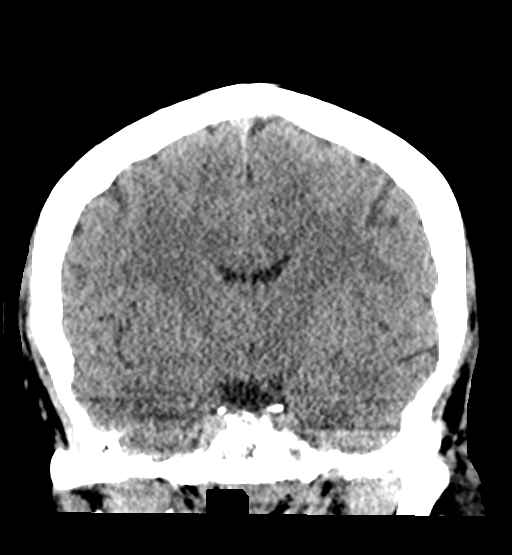

[Series 6: sagittal soft tissue · sagittal · 0.35mm/px · 3 of 56 slices shown]
[im 19/56  brain]
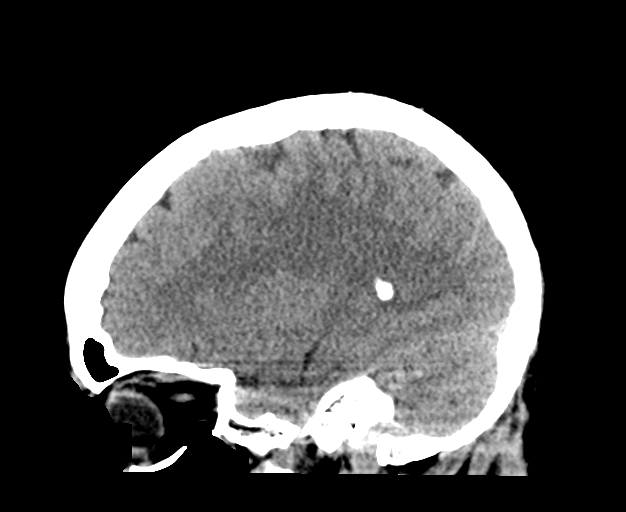
[im 28/56  brain]
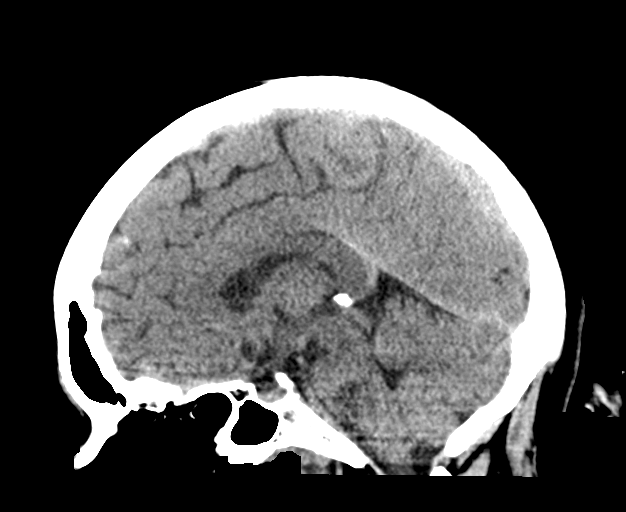
[im 37/56  brain]
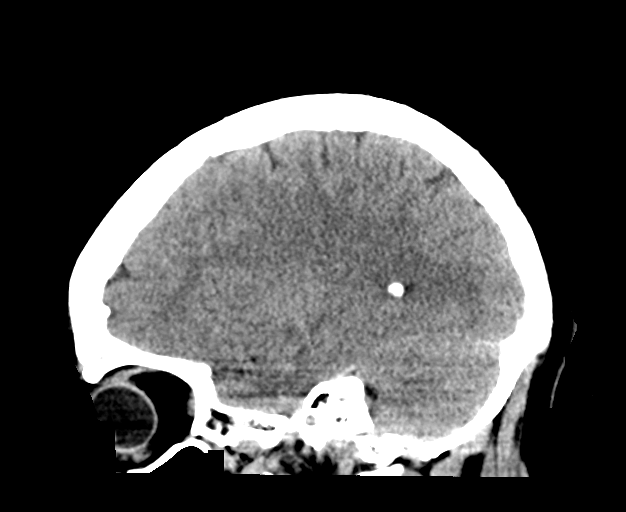

[16 of 47 positions shown; findings below may reference images not displayed]

FINDINGS: Brain: Normal cerebral volume. No midline shift, ventriculomegaly,
mass effect, evidence of mass lesion, intracranial hemorrhage or
evidence of cortically based acute infarction. Gray-white matter
differentiation is within normal limits throughout the brain.

Vascular: No suspicious intracranial vascular hyperdensity.

Skull: Negative.

Sinuses/Orbits: Visualized paranasal sinuses and mastoids are clear.
Tympanic cavities are clear.

Other: Visualized orbits and scalp soft tissues are within normal
limits.
IMPRESSION: Normal noncontrast Head CT.

## 2023-06-03 IMAGING — US US MFM OB FOLLOW-UP
1 series · 13 of 28 positions shown · non-contrast
Comparison: none

[Series 1: us mfm ob follow-up · 13 of 55 slices shown]
[im 3/55]
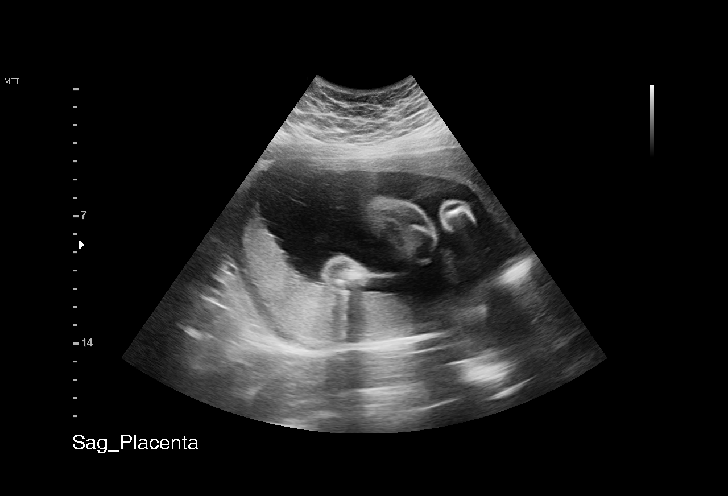
[im 7/55]
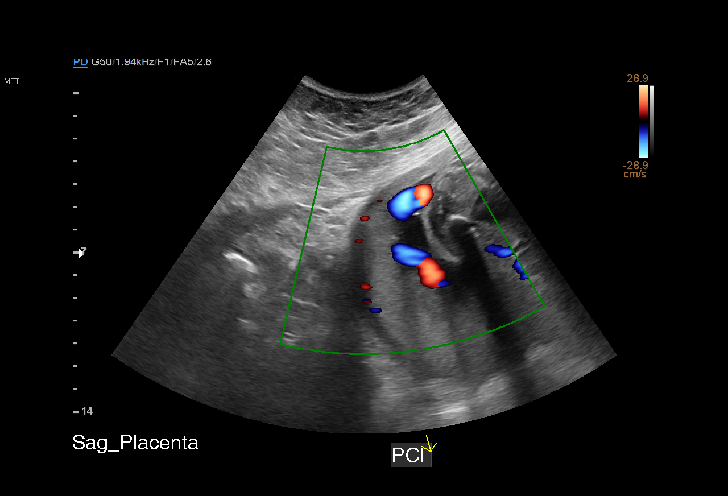
[im 11/55]
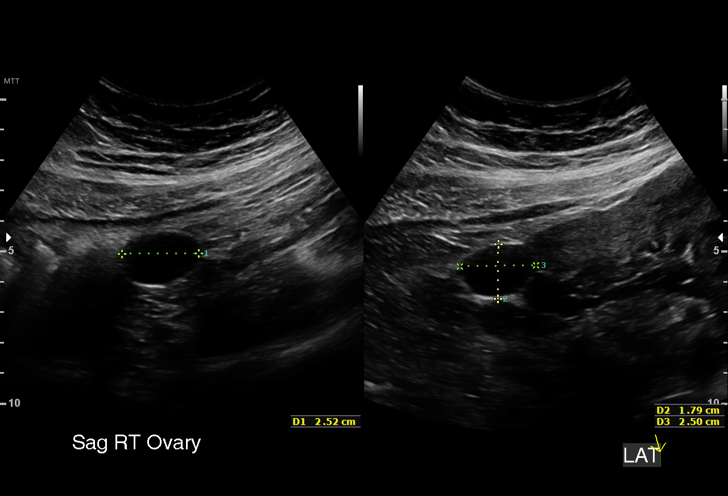
[im 15/55]
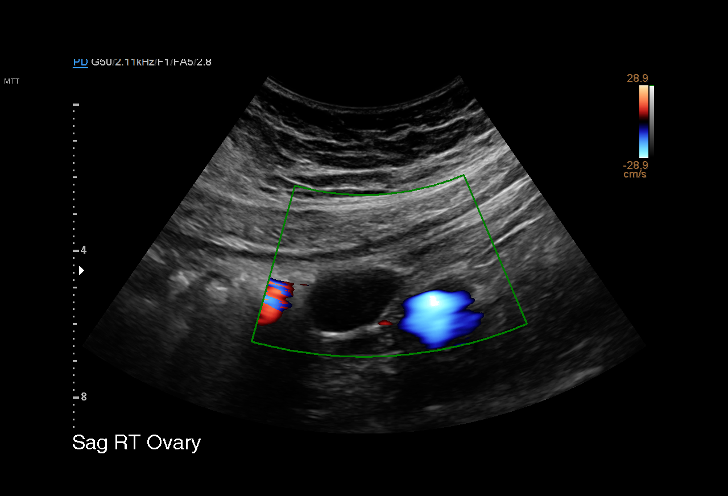
[im 19/55]
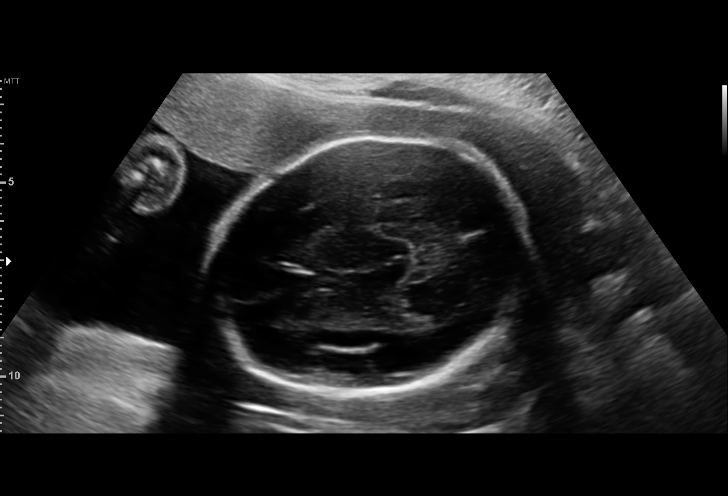
[im 23/55]
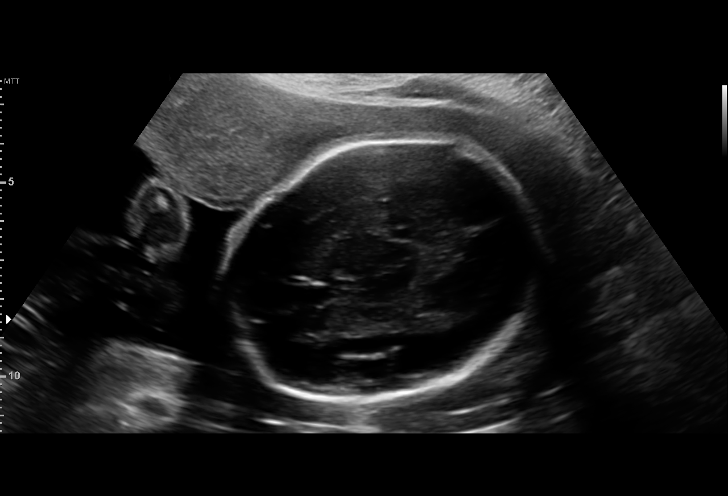
[im 29/55]
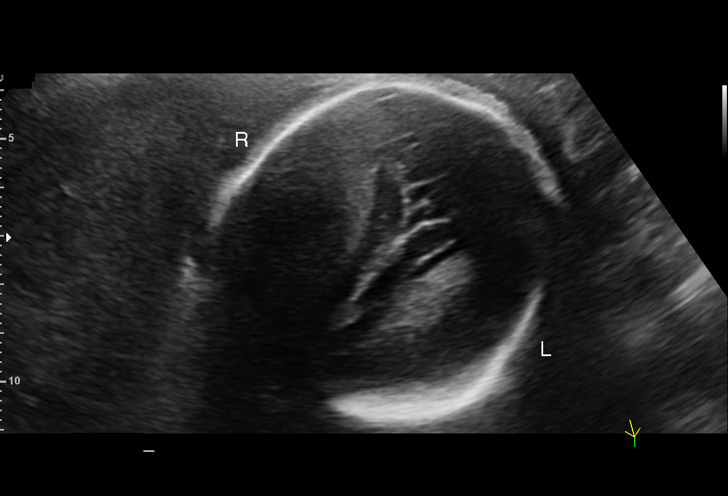
[im 33/55]
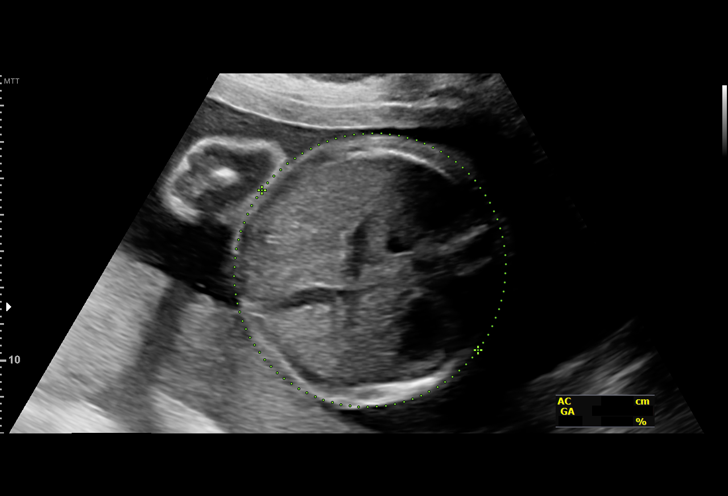
[im 37/55]
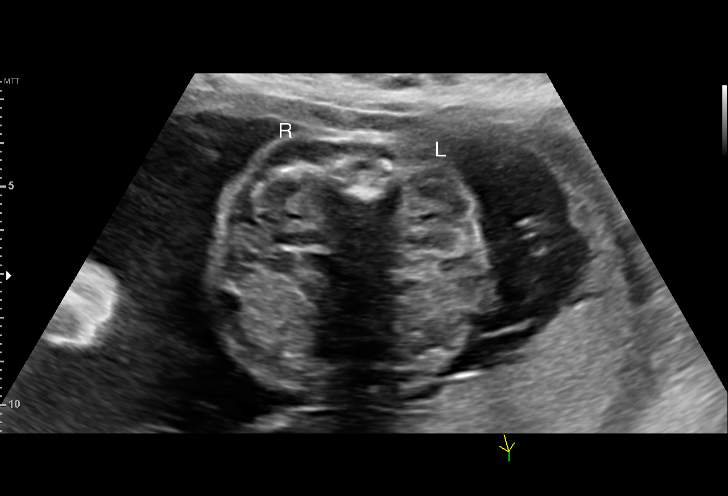
[im 41/55]
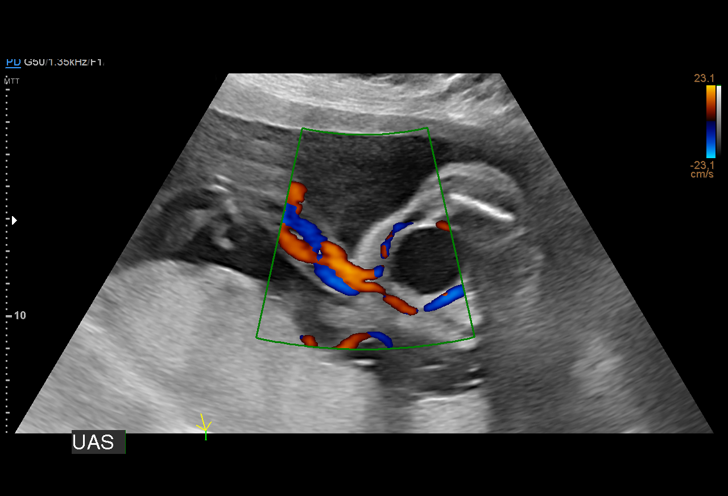
[im 45/55]
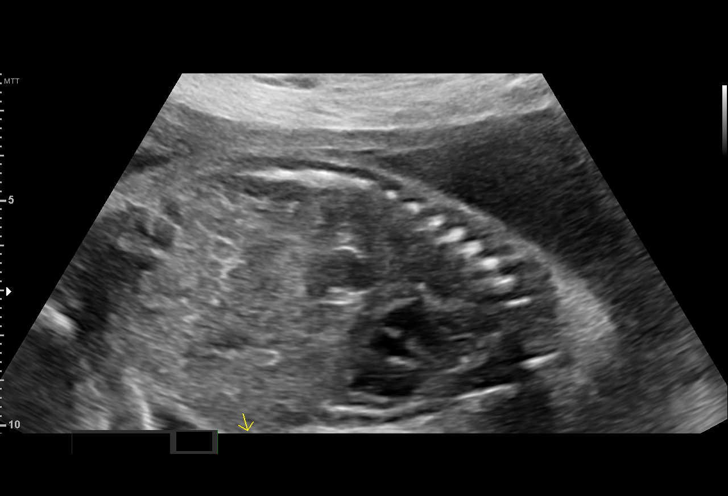
[im 49/55]
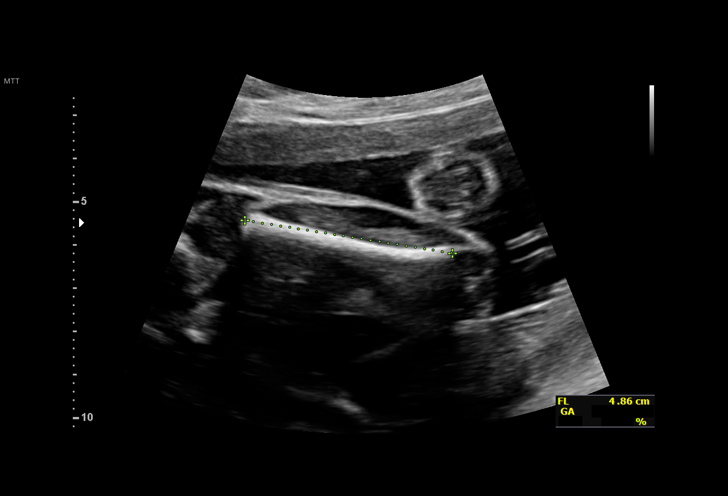
[im 53/55]
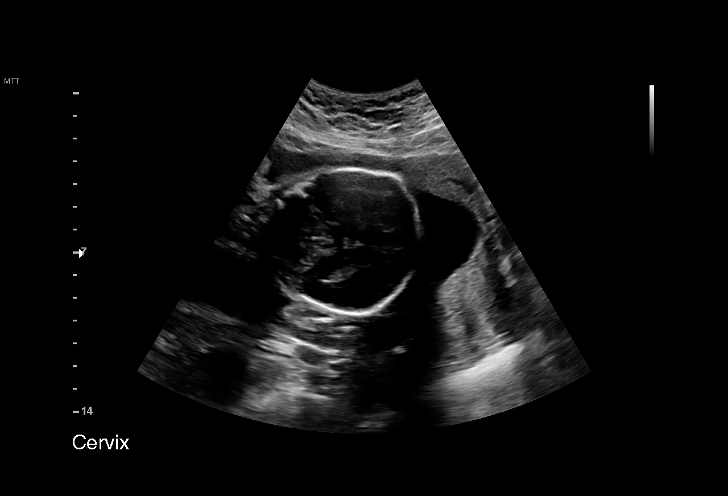

[13 of 28 positions shown; findings below may reference images not displayed]

ASTHA

Indications

 25 weeks gestation of pregnancy
 Obesity complicating pregnancy, second
 trimester (BMI 33)
 Genetic carrier (sickle cell trait)
 Encounter for antenatal screening for
 malformations
 Poor obstetric history: Previous gestational
 diabetes
 Gestational diabetes in pregnancy, insulin
 controlled
Fetal Evaluation

 Num Of Fetuses:         1
 Fetal Heart Rate(bpm):  141
 Cardiac Activity:       Observed
 Presentation:           Cephalic
 Placenta:               Posterior
 P. Cord Insertion:      Previously Visualized

 Amniotic Fluid
 AFI FV:      Polyhydramnios

                             Largest Pocket(cm)

Biometry
 BPD:      65.3  mm     G. Age:  26w 3d         58  %    CI:        74.68   %    70 - 86
                                                         FL/HC:      20.5   %    18.6 -
 HC:      239.8  mm     G. Age:  26w 0d         32  %    HC/AC:      1.02        1.04 -
 AC:      235.6  mm     G. Age:  27w 6d         92  %    FL/BPD:     75.2   %    71 - 87
 FL:       49.1  mm     G. Age:  26w 4d         57  %    FL/AC:      20.8   %    20 - 24
 CER:      28.7  mm     G. Age:  25w 2d         33  %

 LV:        7.7  mm
 CM:        6.5  mm

 Est. FW:    9476  gm      2 lb 4 oz     89  %
OB History

 Blood Type:   O+
 Gravidity:    3         Term:   2
 Living:       2
Gestational Age

 LMP:           25w 6d        Date:  06/10/21                 EDD:   03/17/22
 U/S Today:     26w 5d                                        EDD:   03/11/22
 Best:          25w 6d     Det. By:  LMP  (06/10/21)          EDD:   03/17/22
Anatomy

 Cranium:               Appears normal         LVOT:                   Previously seen
 Cavum:                 Appears normal         Aortic Arch:            Previously seen
 Ventricles:            Appears normal         Ductal Arch:            Previously seen
 Choroid Plexus:        Previously seen        Diaphragm:              Appears normal
 Cerebellum:            Previously seen        Stomach:                Appears normal, left
                                                                       sided
 Posterior Fossa:       Previously seen        Abdomen:                Previously seen
 Nuchal Fold:           Previously seen        Abdominal Wall:         Previously seen
 Face:                  Orbits and profile     Cord Vessels:           Previously seen
                        previously seen
 Lips:                  Previously seen        Kidneys:                Appear normal
 Palate:                Not well visualized    Bladder:                Appears normal
 Thoracic:              Appears normal         Spine:                  Previously seen
 Heart:                 Previously seen        Upper Extremities:      Previously seen
 RVOT:                  Previously seen        Lower Extremities:      Previously seen

 Other:  Female gender previously seen. VC, 3VV and 3VTV visualized.
         Heels/feet and open hands/5th digits visualized previously.
         Technicallly difficult due to advanced GA and maternal habitus.
Cervix Uterus Adnexa

 Cervix
 Length:           3.45  cm.
 Normal appearance by transabdominal scan.

 Uterus
 Normal shape and size.

 Right Ovary
 Within normal limits.
 Left Ovary
 Within normal limits.

 Cul De Sac
 No free fluid seen.

 Adnexa
 No adnexal mass visualized.
Impression

 Gestational diabetes.  Patient takes insulin for control.  She
 takes insulin NPH 25 units in the morning and 10 units at
 night and Humalog [DATE] units with meals.  Her fasting
 levels are still high.

 On today's ultrasound, the estimated fetal weight is at the
 89th percentile.  Amniotic fluid is increased.  Good fetal
 activity is present.

 I emphasized the importance of good blood glucose control
 to prevent fetal adverse outcomes.  I explained the finding of
 polyhydramnios that should be resolve with good control of
 diabetes.

 Patient met with our genetic counselor (sickle cell trait).  You
 will be receiving a separate letter from her.
Recommendations

 -An appointment was made for her to return in 4 weeks for
 fetal growth assessment.
                Amipara, Hiran

## 2023-07-20 IMAGING — US US MFM FETAL BPP W/O NON-STRESS
1 series · 14 of 28 positions shown · non-contrast
Comparison: none

[Series 1: us mfm fetal bpp w/o non-stress · 31 acquisitions, 14 frames shown]
[im 2/31]
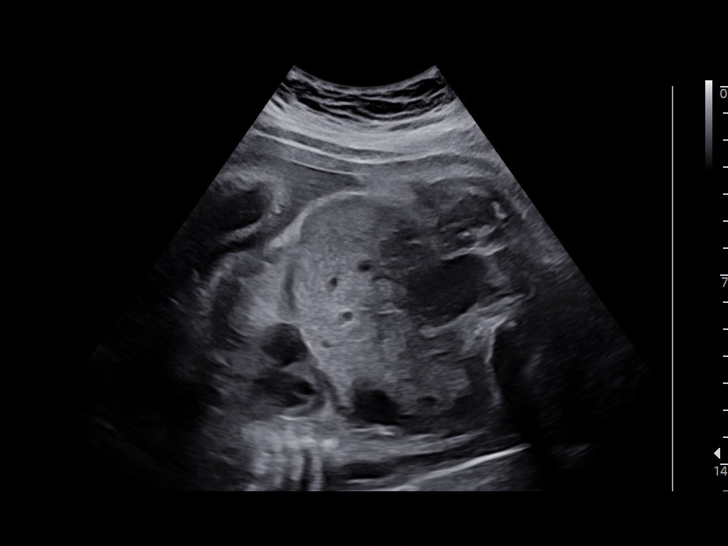
[im 4/31]
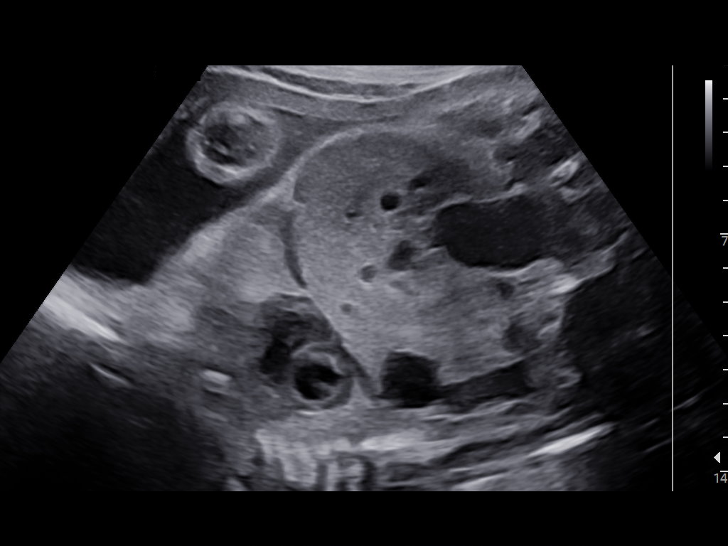
[im 6/31]
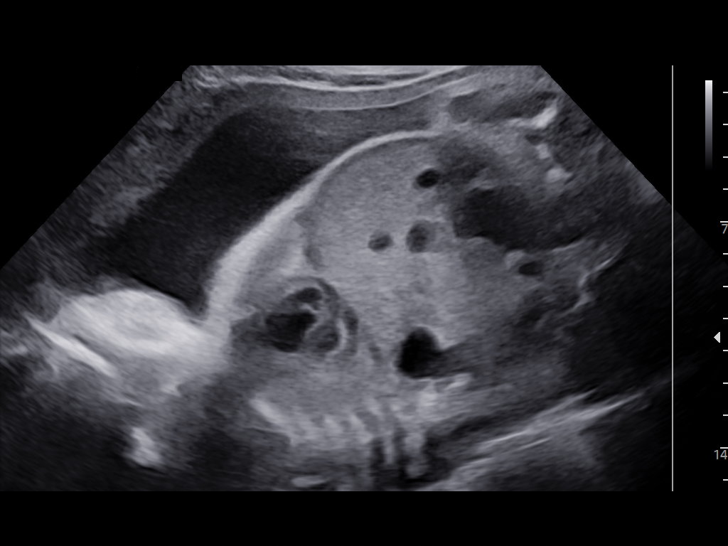
[im 8/31]
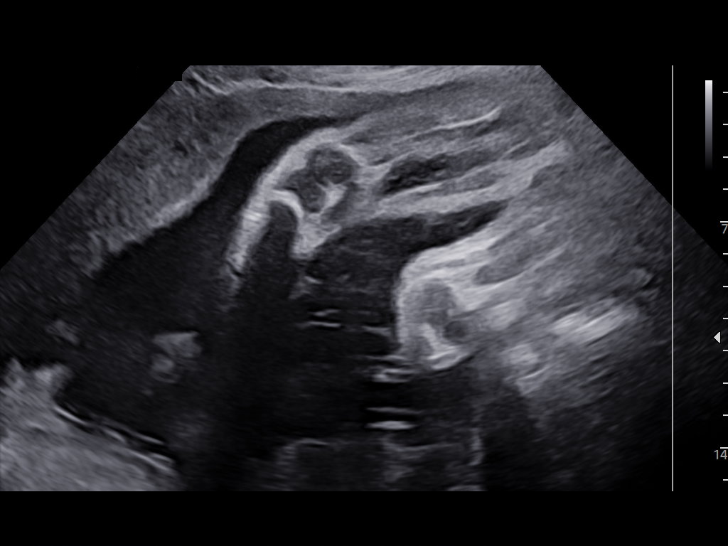
[im 11/31]
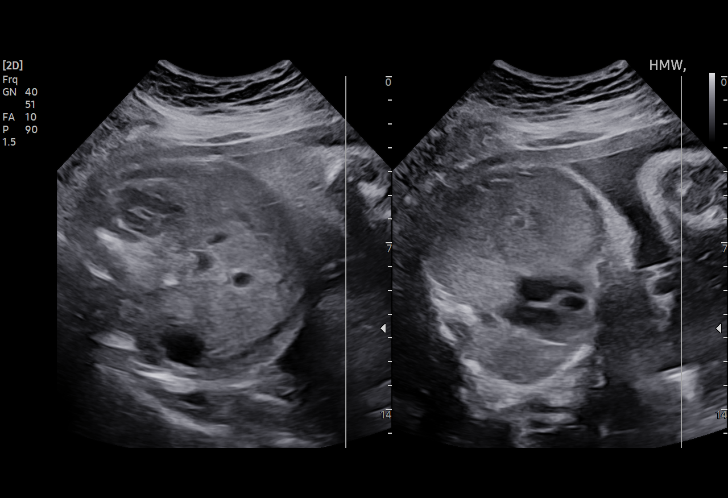
[im 13/31]
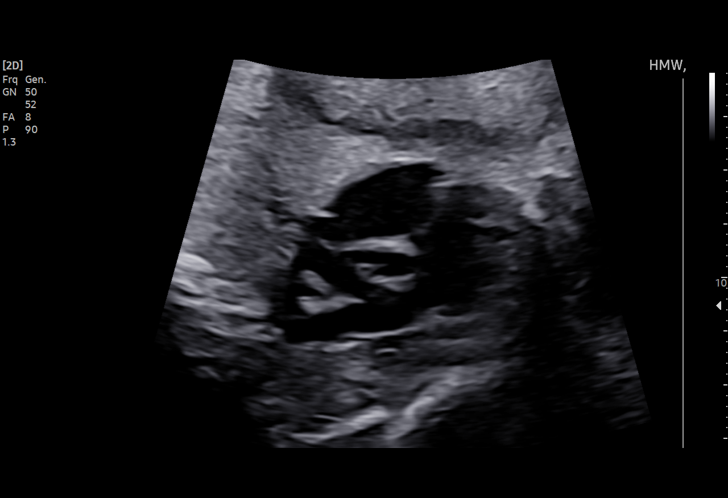
[im 15/31]
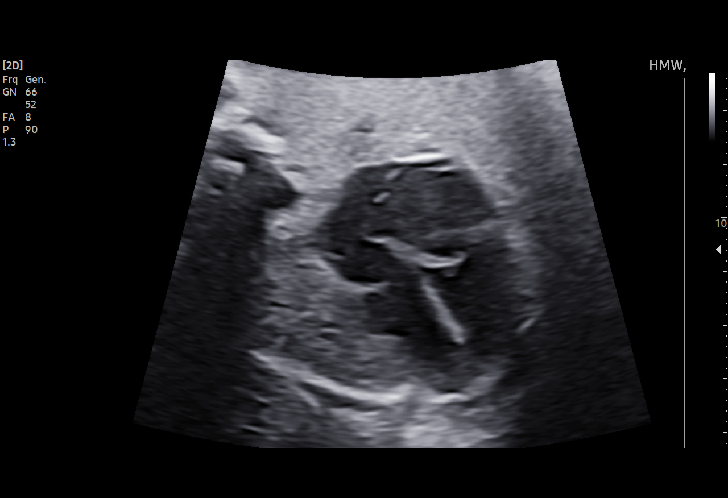
[im 17/31]
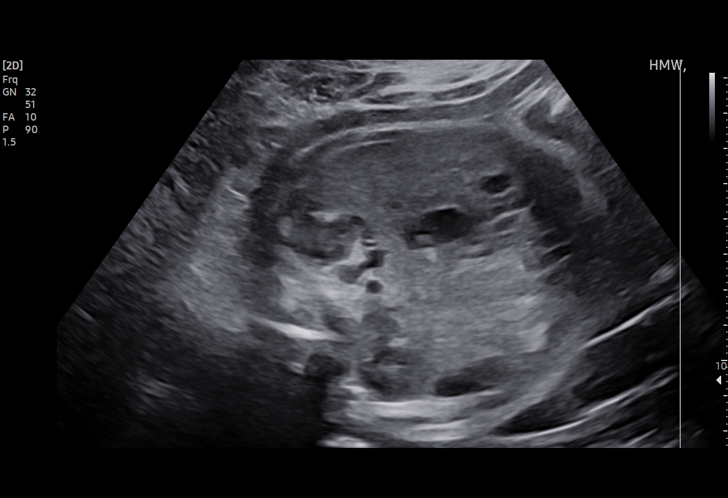
[im 19/31]
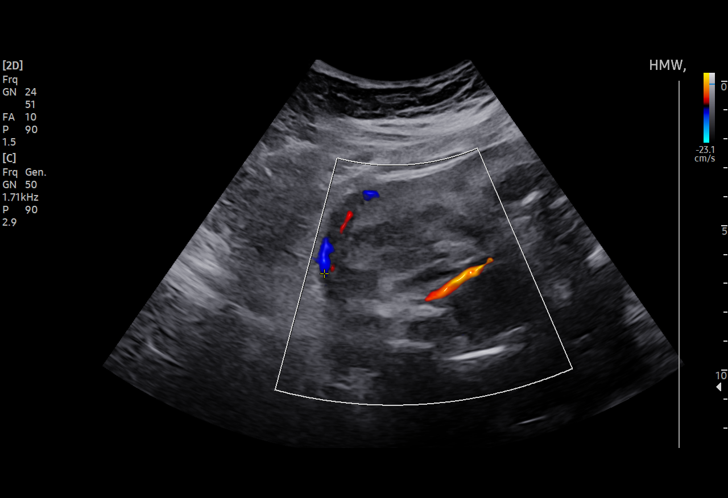
[im 22/31]
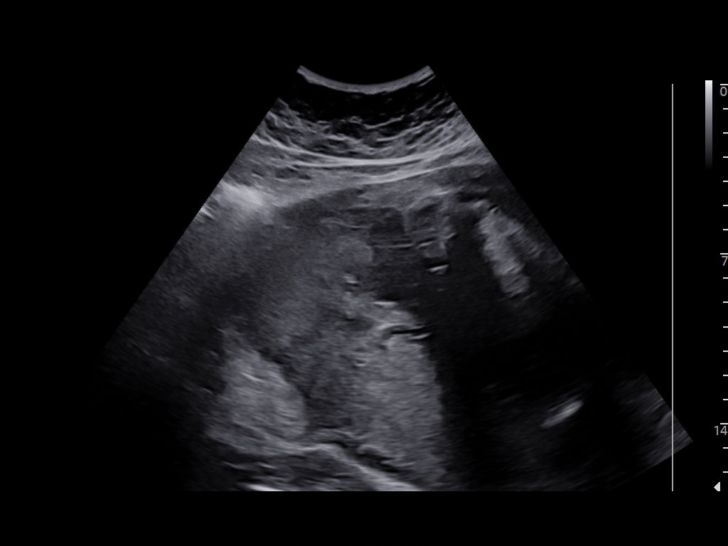
[im 24/31]
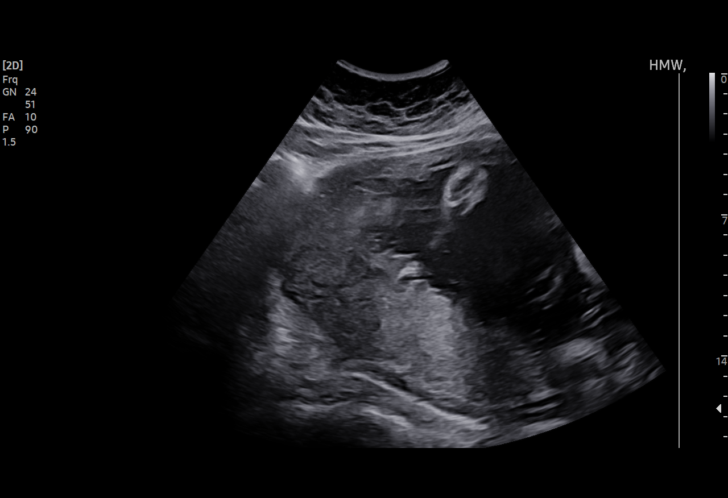
[im 26/31]
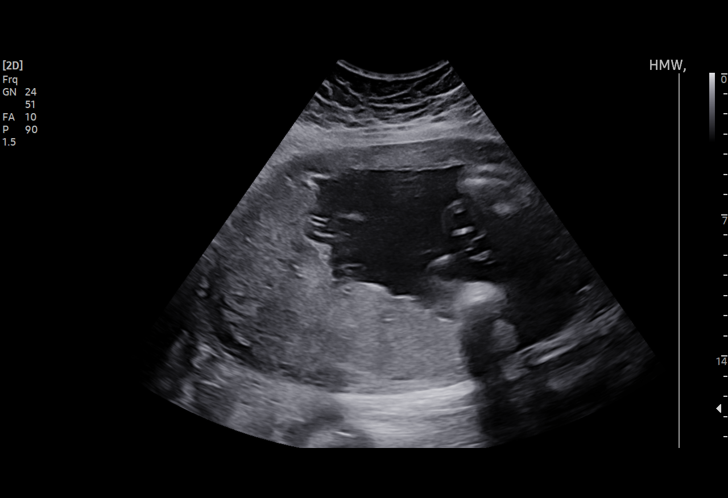
[im 28/31]
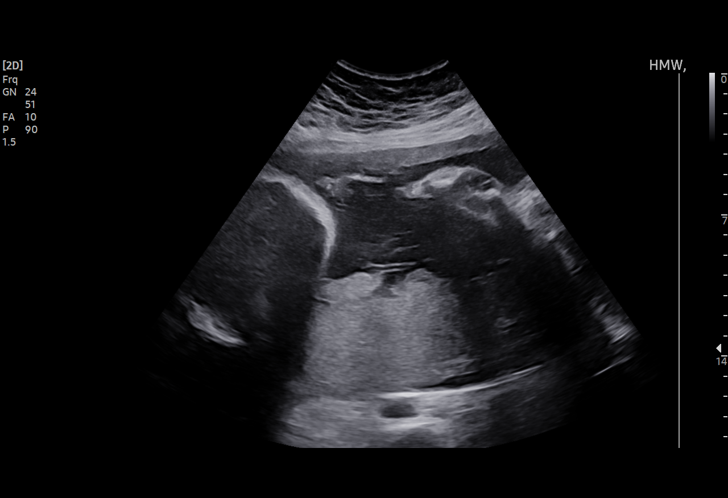
[im 31/31]
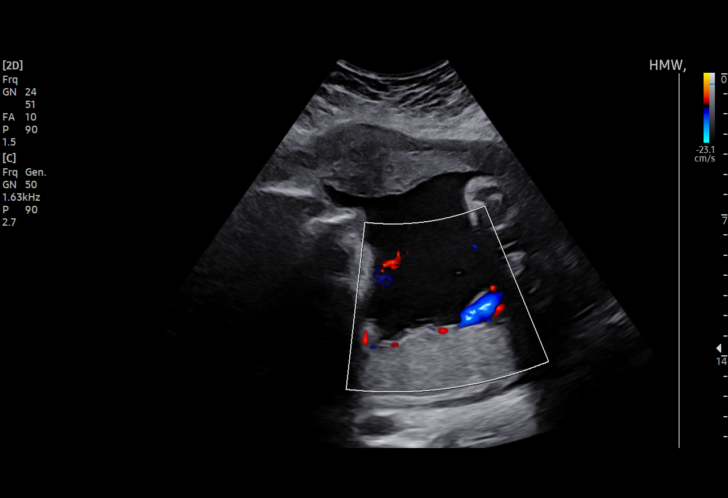

[14 of 28 positions shown; findings below may reference images not displayed]

ROGELIO

Indications

 Gestational diabetes in pregnancy, insulin
 controlled
 Obesity complicating pregnancy, third
 trimester (BMI 33)
 Encounter for antenatal screening,
 unspecified
 Genetic carrier (sickle cell trait)
 Poor obstetric history: Previous gestational
 diabetes
 32 weeks gestation of pregnancy
Fetal Evaluation

 Num Of Fetuses:         1
 Fetal Heart Rate(bpm):  112
 Cardiac Activity:       Observed
 Presentation:           Breech
 Placenta:               Posterior
 P. Cord Insertion:      Previously Visualized

 Amniotic Fluid
 AFI FV:      Within normal limits

 AFI Sum(cm)     %Tile       Largest Pocket(cm)
 15.15           54
 RUQ(cm)       RLQ(cm)       LUQ(cm)        LLQ(cm)
 4.56          5.13          0
Biophysical Evaluation

 Amniotic F.V:   Within normal limits       F. Tone:        Observed
 F. Movement:    Observed                   Score:          [DATE]
 F. Breathing:   Observed
OB History

 Blood Type:   O+
 Gravidity:    3         Term:   2
 Living:       2
Gestational Age

 LMP:           32w 4d        Date:  06/10/21                 EDD:   03/17/22
 Best:          32w 4d     Det. By:  LMP  (06/10/21)          EDD:   03/17/22
Anatomy

 Cranium:               Appears normal         LVOT:                   Appears normal
 Thoracic:              Appears normal         Stomach:                Appears normal, left
                                                                       sided
 Heart:                 Appears normal         Kidneys:                Appear normal
                        (4CH, axis, and
                        situs)
 RVOT:                  Appears normal         Bladder:                Appears normal
Cervix Uterus Adnexa

 Cervix
 Not visualized (advanced GA >34wks)

 Uterus
 No abnormality visualized.

 Right Ovary
 No adnexal mass visualized.

 Left Ovary
 No adnexal mass visualized.

 Cul De Sac
 No free fluid seen.

 Adnexa
 No abnormality visualized.
Impression

 Antenatal testing performed given maternal 23GP8
 The biophysical profile was [DATE] with good fetal movement and
 amniotic fluid volume.
Recommendations
 Continue weekly testing.
